# Patient Record
Sex: Female | Born: 1970
Health system: Southern US, Community
[De-identification: ages and names within clinical notes are randomized; demographics above are authoritative.]

## PROBLEM LIST (undated history)

## (undated) DIAGNOSIS — C541 Malignant neoplasm of endometrium: Secondary | ICD-10-CM

## (undated) HISTORY — PX: HYSTEROSCOPY: SHX211

## (undated) HISTORY — PX: PELVIC LAPAROSCOPY: SHX162

## (undated) HISTORY — DX: Malignant neoplasm of endometrium: C54.1

## (undated) HISTORY — PX: BREAST SURGERY: SHX581

## (undated) HISTORY — PX: ABDOMINAL HYSTERECTOMY: SHX81

---

## 2005-02-07 ENCOUNTER — Other Ambulatory Visit: Admission: RE | Admit: 2005-02-07 | Discharge: 2005-02-07 | Payer: Self-pay | Admitting: Obstetrics and Gynecology

## 2005-10-21 HISTORY — PX: BREAST LUMPECTOMY: SHX2

## 2005-10-28 ENCOUNTER — Encounter (INDEPENDENT_AMBULATORY_CARE_PROVIDER_SITE_OTHER): Payer: Self-pay | Admitting: *Deleted

## 2005-10-28 ENCOUNTER — Ambulatory Visit (HOSPITAL_BASED_OUTPATIENT_CLINIC_OR_DEPARTMENT_OTHER): Admission: RE | Admit: 2005-10-28 | Discharge: 2005-10-28 | Payer: Self-pay | Admitting: General Surgery

## 2006-01-15 ENCOUNTER — Ambulatory Visit (HOSPITAL_COMMUNITY): Admission: RE | Admit: 2006-01-15 | Discharge: 2006-01-15 | Payer: Self-pay | Admitting: Obstetrics and Gynecology

## 2006-10-21 HISTORY — PX: MYOMECTOMY: SHX85

## 2007-01-22 ENCOUNTER — Other Ambulatory Visit: Admission: RE | Admit: 2007-01-22 | Discharge: 2007-01-22 | Payer: Self-pay | Admitting: Gynecology

## 2008-02-18 ENCOUNTER — Other Ambulatory Visit: Admission: RE | Admit: 2008-02-18 | Discharge: 2008-02-18 | Payer: Self-pay | Admitting: Gynecology

## 2008-06-02 ENCOUNTER — Ambulatory Visit (HOSPITAL_COMMUNITY): Admission: RE | Admit: 2008-06-02 | Discharge: 2008-06-02 | Payer: Self-pay | Admitting: Gynecology

## 2008-10-21 DIAGNOSIS — C541 Malignant neoplasm of endometrium: Secondary | ICD-10-CM

## 2008-10-21 HISTORY — DX: Malignant neoplasm of endometrium: C54.1

## 2008-10-21 HISTORY — PX: OOPHORECTOMY: SHX86

## 2010-05-28 ENCOUNTER — Encounter (INDEPENDENT_AMBULATORY_CARE_PROVIDER_SITE_OTHER): Payer: Self-pay | Admitting: *Deleted

## 2010-07-04 ENCOUNTER — Ambulatory Visit: Payer: Self-pay | Admitting: Gastroenterology

## 2010-07-04 ENCOUNTER — Encounter (INDEPENDENT_AMBULATORY_CARE_PROVIDER_SITE_OTHER): Payer: Self-pay | Admitting: *Deleted

## 2010-07-04 DIAGNOSIS — R110 Nausea: Secondary | ICD-10-CM | POA: Insufficient documentation

## 2010-07-13 ENCOUNTER — Ambulatory Visit: Payer: Self-pay | Admitting: Gastroenterology

## 2010-07-19 ENCOUNTER — Ambulatory Visit (HOSPITAL_COMMUNITY): Admission: RE | Admit: 2010-07-19 | Discharge: 2010-07-19 | Payer: Self-pay | Admitting: Gastroenterology

## 2010-09-28 ENCOUNTER — Ambulatory Visit: Payer: Self-pay | Admitting: Gastroenterology

## 2010-09-28 DIAGNOSIS — R109 Unspecified abdominal pain: Secondary | ICD-10-CM | POA: Insufficient documentation

## 2010-10-11 ENCOUNTER — Ambulatory Visit (HOSPITAL_COMMUNITY)
Admission: RE | Admit: 2010-10-11 | Discharge: 2010-10-11 | Payer: Self-pay | Source: Home / Self Care | Attending: Gastroenterology | Admitting: Gastroenterology

## 2010-10-12 DIAGNOSIS — R143 Flatulence: Secondary | ICD-10-CM

## 2010-10-12 DIAGNOSIS — R141 Gas pain: Secondary | ICD-10-CM | POA: Insufficient documentation

## 2010-10-12 DIAGNOSIS — R142 Eructation: Secondary | ICD-10-CM

## 2010-10-30 ENCOUNTER — Ambulatory Visit (HOSPITAL_COMMUNITY)
Admission: RE | Admit: 2010-10-30 | Discharge: 2010-10-30 | Payer: Self-pay | Source: Home / Self Care | Attending: Gastroenterology | Admitting: Gastroenterology

## 2010-11-10 ENCOUNTER — Encounter: Payer: Self-pay | Admitting: Obstetrics and Gynecology

## 2010-11-11 ENCOUNTER — Encounter: Payer: Self-pay | Admitting: Obstetrics and Gynecology

## 2010-11-20 NOTE — Letter (Signed)
Summary: EGD Instructions  [facility]  [address] [place], [place] [address]   Phone: [phone]  Fax: [phone]       Joanna Barker    1971/03/19    MRN: [id number]       Procedure Day /Date:07/13/10 [name]     Arrival Time: 930 am     Procedure Time:1030 am     Location of Procedure:                    [facility] (4th Floor)    PREPARATION FOR ENDOSCOPY   On 07/13/10  THE DAY OF THE PROCEDURE:  1.   No solid foods, milk or milk products are allowed after midnight the night before your procedure.  2.   Do not drink anything colored red or purple.  Avoid juices with pulp.  No orange juice.  3.  You may drink clear liquids until 830 am, which is 2 hours before your procedure.                                                                                                CLEAR LIQUIDS INCLUDE: Water Jello Ice Popsicles Tea (sugar ok, no milk/cream) Powdered fruit flavored drinks Coffee (sugar ok, no milk/cream) Gatorade Juice: apple, white grape, white cranberry  Lemonade Clear bullion, consomm, broth Carbonated beverages (any kind) Strained chicken noodle soup Hard Candy   MEDICATION INSTRUCTIONS  Unless otherwise instructed, you should take regular prescription medications with a small sip of water as early as possible the morning of your procedure.            OTHER INSTRUCTIONS  You will need a responsible adult at least 38 years of age to accompany you and drive you home.   This person must remain in the waiting room during your procedure.  Wear loose fitting clothing that is easily removed.  Leave jewelry and other valuables at home.  However, you may wish to bring a book to read or an iPod/MP3 player to listen to music as you wait for your procedure to start.  Remove all body piercing jewelry and leave at home.  Total time from sign-in until discharge is approximately 2-3 hours.  You should go home directly  after your procedure and rest.  You can resume normal activities the day after your procedure.  The day of your procedure you should not:   Drive   Make legal decisions   Operate machinery   Drink alcohol   Return to work  You will receive specific instructions about eating, activities and medications before you leave.    The above instructions have been reviewed and explained to me by   _______________________    I fully understand and can verbalize these instructions _____________________________ Date _________

## 2010-11-20 NOTE — Assessment & Plan Note (Signed)
  Review of gastrointestinal problems: 1. Nausea, ? due to gastritis that was noted on October 2011.  H. pylori biopsies were negative. Previously was treated for H. pylori and EGD er symptoms improved, this was based on serologic evidence. Abdominal ultrasound October 2011 normal.  Trial of Carafate made her symptoms worse.  Nexium was not helpful.    History of Present Illness Visit Type: Follow-up Visit Primary GI MD: Rob Bunting MD Primary Tayshon Winker: Herb Grays, MD Requesting Samual Beals: na Chief Complaint: Patient here to f/u after endoscopy. She complains of continued daily nauesa. History of Present Illness:     she is still nauseas.  A couple days it has been better, wondering if stress is related.  She has noticed being more gassy lately.    she tried carafate for 3-4 weeks, this made her feel worse.  Much more nauseas.  The nausea is fairly constant, not specifically worse after eating.             Current Medications (verified): 1)  Advil 200 Mg Tabs (Ibuprofen) .... As Needed 2)  Hyoscyamine Sulfate 0.125 Mg Subl (Hyoscyamine Sulfate) .... As Needed  Allergies (verified): No Known Drug Allergies  Vital Signs:  Patient profile:   40 year old female Height:      67 inches Weight:      195.38 pounds BMI:     30.71 BSA:     2.00 Pulse rate:   68 / minute Pulse rhythm:   regular BP sitting:   110 / 78  (left arm)  Vitals Entered By: Lamona Curl CMA Duncan Dull) (September 28, 2010 2:14 PM)  Physical Exam  Additional Exam:  Constitutional: generally well appearing Psychiatric: alert and oriented times 3 Abdomen: soft, non-tender, non-distended, normal bowel sounds    Impression & Recommendations:  Problem # 1:  nausea her symptoms are not classic GERD or biliary colic. Carafate make things worse. I will try her on a different proton pump inhibitor, see below. We will also set up HIDA scan to estimate her gallbladder ejection fraction and if she had  significantly low gallbladder ejection fraction then I would likely set her up to see a surgeon to consider cholecystectomy.  Patient Instructions: 1)  HIDA scan to estimate gallbladder function. 2)  Samples of dexilant given, take 1 pill every day. 3)  A copy of this information will be sent to Dr. Collins Scotland. 4)  The medication list was reviewed and reconciled.  All changed / newly prescribed medications were explained.  A complete medication list was provided to the patient / caregiver.  Appended Document: Orders Update/HIDA    Clinical Lists Changes  Problems: Added new problem of ABDOMINAL PAIN OTHER SPECIFIED SITE (ICD-789.09) Orders: Added new Referral order of HIDA Scan (HIDA SCAN) - Signed

## 2010-11-20 NOTE — Procedures (Signed)
Summary: Upper Endoscopy  Patient: Joanna Barker Note: All result statuses are Final unless otherwise noted.  Tests: (1) Upper Endoscopy (EGD)   EGD Upper Endoscopy       DONE     [facility]     [address]     [place], [place]  [address]           ENDOSCOPY PROCEDURE REPORT           PATIENT:  [name], [name]  MR#:  [id number]     BIRTHDATE:  1971-02-16, 39 yrs. old  GENDER:  female     NDOSCOPIST:  Rachael Fee, MD     Referred by:  Herb Grays, M.D.     PROCEDURE DATE:  07/13/2010     PROCEDURE:  EGD with biopsy     ASA CLASS:  Class I     INDICATIONS:  nausea, was serologically H. pylori + several months     ago, treated     MEDICATIONS:  Fentanyl 50 mcg IV, Versed 6 mg IV     TOPICAL ANESTHETIC:  Exactacain Spray           DESCRIPTION OF PROCEDURE:   After the risks benefits and     alternatives of the procedure were thoroughly explained, informed     consent was obtained.  The LB GIF-H180 [id number] endoscope was     introduced through the mouth and advanced to the second portion of     the duodenum, without limitations.  The instrument was slowly     withdrawn as the mucosa was fully examined.     <<PROCEDUREIMAGES>>     Mild gastritis was found. There was mild, non-specific gastritis.     This was biopsied to check for H. pylori (pathology jar 1) (see     image2).  Otherwise the examination was normal (see image3,     image4, image5, image6, and image8).    Retroflexed views revealed     no abnormalities.    The scope was then withdrawn from the patient     and the procedure completed.     COMPLICATIONS:  None           ENDOSCOPIC IMPRESSION:     1) Mild gastritis, biopsies taken for (recurrent) H. pylori     2) Otherwise normal examination           RECOMMENDATIONS:     If biopsies show H. pylori, she will be started on appropriate     antibiotics.  If not, will like proceed with EGD to check for     biliary problems.        ______________________________     Rachael Fee, MD           n.     eSIGNED:   Rachael Fee at 07/13/2010 10:40 AM           Vernona Rieger, [phone]  Note: An exclamation mark (!) indicates a result that was not dispersed into the flowsheet. Document Creation Date: 07/13/2010 10:40 AM _______________________________________________________________________  (1) Order result status: Final Collection or observation date-time: 07/13/2010 10:33 Requested date-time:  Receipt date-time:  Reported date-time:  Referring Physician:   Ordering Physician: Rob Bunting [phone]) Specimen Source:  Source: Launa Grill Order Number: [id number] Lab site:

## 2010-11-20 NOTE — Miscellaneous (Signed)
Summary: Dexilant samples  Clinical Lists Changes  Medications: Added new medication of DEXILANT 60 MG CPDR (DEXLANSOPRAZOLE) 1 by mouth once daily 

## 2010-11-20 NOTE — Letter (Signed)
Summary: New Patient letter  [facility]  [address] [place], [place] [address]   Phone: [phone]  Fax: [phone]       05/28/2010 MRN: [id number]  [facility] [address] [place], [place]  [address]  Dear [name],  Welcome to the Gastroenterology Division at [facility].    You are scheduled to see Dr.  Christella Hartigan  on 07/04/10  at 2:15 pm on the 3rd floor at [facility], [address]  We ask that you try to arrive at our office 15 minutes prior to your appointment time to allow for check-in.  We would like you to complete the enclosed self-administered evaluation form prior to your visit and bring it with you on the day of your appointment.  We will review it with you.  Also, please bring a complete list of all your medications or, if you prefer, bring the medication bottles and we will list them.  Please bring your insurance card so that we may make a copy of it.  If your insurance requires a referral to see a specialist, please bring your referral form from your primary care physician.  Co-payments are due at the time of your visit and may be paid by cash, check or credit card.     Your office visit will consist of a consult with your physician (includes a physical exam), any laboratory testing he/she may order, scheduling of any necessary diagnostic testing (e.g. x-ray, ultrasound, CT-scan), and scheduling of a procedure (e.g. Endoscopy, Colonoscopy) if required.  Please allow enough time on your schedule to allow for any/all of these possibilities.    If you cannot keep your appointment, please call [phone] to cancel or reschedule prior to your appointment date.  This allows [place] the opportunity to schedule an appointment for another patient in need of care.  If you do not cancel or reschedule by 5 p.m. the business day prior to your appointment date, you will be charged a $50.00 late cancellation/no-show fee.    Thank you for  choosing [facility] for your medical needs.  We appreciate the opportunity to care for you.  Please visit [place] at our website  to learn more about our practice.                     Sincerely,                                                             The Gastroenterology Division

## 2010-11-20 NOTE — Assessment & Plan Note (Signed)
Summary: Nausea x 1 month/pl   History of Present Illness Visit Type: new patient  Primary GI MD: Rob Bunting MD Primary Provider: Earma Reading, MD  Requesting Provider: na Chief Complaint: nausea  History of Present Illness:     very pleasant 40 year old woman who has had nausea since early July.  Will wax and wane, but alway a low level of nausea.  No vomiting.  She had a similar set of symptoms back in April.  Was found to have H. pylori by blood tests, treated with pre-pac.  She completed it, felt better for 2 months then the same symptoms.  She saw Dr. Bosie Clos, was given sample and noticed no change.  Was 45 min before breakfast.  No narcotic.  Takes one advil every 3-4 weeks.  She had hysterectomy last year, was going through infertility treatments and found to have endometrial cancer.  Alternates between constip/diarrhea.  Has never tried fiber supplement.s  overall stable weight.           Current Medications (verified): 1)  Advil 200 Mg Tabs (Ibuprofen) .... As Needed 2)  Hyoscyamine Sulfate 0.125 Mg Subl (Hyoscyamine Sulfate) .... 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  All other review of systems was otherwise negative.   Vital Signs:  Patient profile:   40 year old female Height:      67 inches Weight:      202 pounds BMI:      31.75 BSA:     2.03 Pulse rate:   68 / minute Pulse rhythm:   regular BP sitting:   124 / 80  (left arm) Cuff size:   regular  Vitals Entered By: Ok Anis CMA (July 04, 2010 2:22 PM)  Physical Exam  Additional Exam:  Constitutional: generally well appearing Psychiatric: alert and oriented times 3 Eyes: extraocular movements intact Mouth: oropharynx moist, no lesions Neck: supple, no lymphadenopathy Cardiovascular: heart regular rate and rythm Lungs: CTA bilaterally Abdomen: soft, non-tender, non-distended, no obvious ascites, no peritoneal signs, normal bowel sounds Extremities: no lower extremity edema bilaterally Skin: no lesions on visible extremities    Impression & Recommendations:  Problem # 1:  Chronic nausea it is very interesting that following Prevpac treatment her nausea was much improved for about 2 months. I think we should proceed with EGD at her soonest convenience to see if she has persistent H. pylori, perhaps gastritis, ulcer disease. She has really not noticed any significant improvement on proton pump inhibitor for 3-4 week trial and so does not seem acid alone is causing her symptoms. If EGD is unrevealing then the next step would be biliary workup.  Patient Instructions: 1)  You will be scheduled to have an upper endoscopy. 2)  If this is not really helpful, then will consider gallbladder pathology. 3)  A copy of this information will be sent to Dr. Romeo Rabon. 4)  The medication list was reviewed and reconciled.  All changed / newly prescribed medications were explained.  A complete medication list was provided to the patient / caregiver.  Appended Document: Orders Update/EGD    Clinical Lists Changes  Problems: Added new problem of NAUSEA ALONE (ICD-787.02) Orders: Added new Test order of EGD (EGD) - Signed

## 2011-03-08 NOTE — Op Note (Signed)
NAMEMarland Barker  LARAH, KUNTZMAN                 ACCOUNT NO.:  [id number]   MEDICAL RECORD NO.:  [id number]          PATIENT TYPE:  AMB   LOCATION:  DSC                          FACILITY:  [facility]   PHYSICIAN:  Rose Phi. Maple Hudson, M.D.   DATE OF BIRTH:  Mar 14, 1971   DATE OF PROCEDURE:  10/28/2005  DATE OF DISCHARGE:                                 OPERATIVE REPORT   PREOPERATIVE DIAGNOSIS:  Right breast mass.   POSTOPERATIVE DIAGNOSIS:  Right breast mass.   OPERATION:  Excision of right breast mass.   SURGEON:  Rose Phi. Maple Hudson, M.D.   ANESTHESIA:  MAC   OPERATIVE PROCEDURE:  This 40 year old female had been referred by Dr.  Marcelle Overlie because of a palpable nodule at the 7 o'clock position of  her right breast. Mr. And Mrs. Reppond were trying to get pregnant, and Dr.  Lynnell Dike concern was that this was enlarge during the pregnancy and become a  diagnostic problem. She is set up now for excision.   The patient was placed on the operating table and the right breast prepped  and draped in the usual fashion. The palpable nodule was subareolar,  centered at about the 7 o'clock position; and a small curved incision was  then outlined.. The area was infiltrated with the local anesthetic mixture  and an incision made and the mass excised. It really did not feel or look  like a fibroadenoma, but it also did not look malignant. It was submitted to  the pathologist for evaluation. Hemostasis obtained with the cautery.  Subcuticular closure of 4-0 Monocryl and Steri-Strips carried out. Dressing  applied. The patient transferred to the recovery room in satisfactory  condition having tolerated the procedure well.      Rose Phi. Maple Hudson, M.D.  Electronically Signed     PRY/MEDQ  D:  10/28/2005  T:  10/28/2005  Job:  [id number]   cc:   Marcelino Duster L. Vincente Poli, M.D.  Fax: [phone]

## 2012-08-27 ENCOUNTER — Ambulatory Visit (INDEPENDENT_AMBULATORY_CARE_PROVIDER_SITE_OTHER): Payer: 59 | Admitting: Sports Medicine

## 2012-08-27 VITALS — BP 120/70 | Ht 67.0 in | Wt 160.0 lb

## 2012-08-27 DIAGNOSIS — IMO0002 Reserved for concepts with insufficient information to code with codable children: Secondary | ICD-10-CM

## 2012-08-27 DIAGNOSIS — M7050 Other bursitis of knee, unspecified knee: Secondary | ICD-10-CM

## 2012-08-27 DIAGNOSIS — M25569 Pain in unspecified knee: Secondary | ICD-10-CM

## 2012-08-27 MED ORDER — MELOXICAM 15 MG PO TABS: ORAL_TABLET | ORAL | Status: DC

## 2012-08-27 NOTE — Progress Notes (Signed)
Patient ID: Joanna Barker, female   DOB: 10-27-1970, 41 y.o.   MRN: [id number]  SUBJECTIVE: Joanna Barker is a 41 y.o. female with unremarkable musculoskeletal history who presents for LEFT medial knee pain.  Pt reports that while running the [employer] marathon on 16-Aug-2012 she was running well at her race pace and pain free (10:30 / mile).  She decided to reward herself by walking for a short bit at mile 20.5 and on her third step while walking she experienced sudden, sharp pain along the medial aspect of her LEFT knee.  She reports that it felt like her knee was very tight, stiff and she had to stop and stretch.  She was able to complete the marathon but had significant pain and predominantly walked the remainder of the race, only slowly jogging in short bursts.  She had some popping and clicking in her knee during the remainder of the race but not since.  No true locking or catching, no instability.  enies stepping in a hole, trauma, twisting ankle...etc at time of onset.  No prior history of knee, or any orthopedic, injuries.  She took 1 week off following the race.  Then on Monday of this week tried to do a 3 mile run but had significant pain and had to stop and walk on several occasions.  Denies any significant pain with walking, only occasional and after prolonged walking especially uphill or up a ramp.    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 Appearance - fit, well appearing female in no acute distress  Knee exam:   - No gross abnormalities on inspection - AROM symmetric and appropriate in knee flexion, extension with no pain in either - 5/5 strength in each of the above without pain - 5/5 strength in hip abduction, adduction (lateral seated) and flexion/extension (seated/prone) - On palpation there is moderate medial joint line tenderness of the LEFT knee; patient also moderate tenderness to palpation of pes anserine bursa on LEFT knee.  Pt reports slightly more pain @ pes when comparing palpation of both - Negative dial test bilaterally - Negative thessaly test bilaterally at 5 and 30 degrees knee flexion - Negative McMurray's bialterally - Negative Lachman's, Anterior / Posterior drawer - Negative valgus and varus stress testing with firm endpoints.  Pain when hand is placed over medial joint line for varus stress testing. - Negative heel tap test, negative to palpation and tapping along length of tibia - Negative hop test bilaterally  [facility] ultrasound of the left knee showed no obvious abnormality of the medial meniscus. There is some mild edema at the pes anserine bursa. No significant increase in neovascularity.   ASSESSMENT: 1. Pes Anserine Bursitis (most likely), LEFT knee - (less likely but considered) medial meniscus injury and tibial stress fracture  PLAN: 41 y/o female with unremarkable orthopedic history presenting for medial knee pain of sudden onset while running marathon.  Pain persisted despite 1 week of rest.  Historical and physical exam findings above support pes anserine bursitis as most likely source of  pain, although we also considered medial meniscus injury and tibial stress fracture.  The other two differentials are not supported by exam findings as much as pes anserine bursitis.  She was given body helix compression sleeve for LEFT knee to wear when active and for 1 hour afterward.  She will cross-train or rest for the next 3 weeks to allow bursitis to heal, this would be similar  initial approach for the other two injuries as well.  She will follow-up in three weeks for re-evaluation of pain and re-assessment.  If pain persists despite rest, we will consider more aggressive imaging of the knee to r/o the aforementioned possibilities and/or injection of pes anserine bursa to calm down inflammation and facilitate healing.  -- Kenney Houseman, MS IV

## 2012-09-15 ENCOUNTER — Ambulatory Visit (INDEPENDENT_AMBULATORY_CARE_PROVIDER_SITE_OTHER): Payer: 59 | Admitting: Sports Medicine

## 2012-09-15 VITALS — BP 120/60 | Ht 67.0 in | Wt 158.5 lb

## 2012-09-15 DIAGNOSIS — M25562 Pain in left knee: Secondary | ICD-10-CM

## 2012-09-15 DIAGNOSIS — M25569 Pain in unspecified knee: Secondary | ICD-10-CM

## 2012-09-15 NOTE — Patient Instructions (Addendum)
Due to financial reasons, scheduled pt to go to [facility] t[facility] December 3rd at 915am. Pt aware. [phone]

## 2012-09-15 NOTE — Progress Notes (Signed)
  Subjective:    Patient ID: Joanna Barker, female    DOB: [birth year], 41 y.o.   MRN: [id number]  HPI Patient comes in today for followup on left knee pain. She's had little improvement in pain since her last office visit. Mobic was not very helpful. Body heel is compression sleeve is minimally helpful. She localizes all of her pain now to the medial joint line. The pain she was getting over the pes anserine bursa previously has improved. She has been crosstraining on both a bike and elliptical. No real pain while exercising but she has post exercise pain and catching. No noticeable swelling. Pain improves at rest. Her symptoms all began after running a marathon. [facility] ultrasound done at her last visit showed no obvious meniscal tear she did have some fluid in the area of the pes anserine bursa.    Review of Systems     Objective:   Physical Exam Well-developed, well-nourished. No acute distress.  Left knee: Range of motion 0-130. She is tender to palpation along the medial joint line with a positive McMurray's on today's exam. No tenderness along the lateral joint line. No tenderness at the pes anserine bursa. Knee remained stable to ligamentous exam. She walks with a slight limp.       Assessment & Plan:  1. Persistent left knee pain-rule out proximal tibial stress fracture versus medial meniscal tear  I will start with plain x-rays of the left knee. If unremarkable we will proceed with an MRI scan. She will continue to cross train in the interim. She can discontinue Mobic since it's been ineffective. Continue with the compression sleeve with activity. I will call her after her MRI to go over those results and delineate further treatment.

## 2012-09-15 NOTE — Addendum Note (Signed)
Addended by: Annita Brod on: 09/15/2012 02:03 PM   Modules accepted: Orders

## 2012-09-24 ENCOUNTER — Other Ambulatory Visit: Payer: 59

## 2012-09-29 ENCOUNTER — Encounter: Payer: Self-pay | Admitting: Sports Medicine

## 2012-09-29 ENCOUNTER — Telehealth: Payer: Self-pay | Admitting: Sports Medicine

## 2012-09-29 NOTE — Telephone Encounter (Signed)
I spoke with Joanna Barker yesterday on the phone regarding MRI findings of her left knee. No evidence of meniscal tearing or stress fracture. She has some edema of the lateral aspect of the infrapatellar fat pad. Some mild chondromalacia patella as well. I reassured her that with these findings she can continue to increase her activity as tolerated including running. She did not notice any benefit from Mobic so I recommended that she try a simple over-the-counter Aleve as needed. Also discussed the possibility of physical therapy if her symptoms persist. She will followup for ongoing or recalcitrant issues.

## 2013-11-30 ENCOUNTER — Encounter: Payer: Self-pay | Admitting: Family Medicine

## 2013-11-30 ENCOUNTER — Ambulatory Visit (HOSPITAL_BASED_OUTPATIENT_CLINIC_OR_DEPARTMENT_OTHER)
Admission: RE | Admit: 2013-11-30 | Discharge: 2013-11-30 | Disposition: A | Discharge status: Home / Self Care | Payer: 59 | Source: Ambulatory Visit | Attending: Family Medicine | Admitting: Family Medicine

## 2013-11-30 ENCOUNTER — Ambulatory Visit (INDEPENDENT_AMBULATORY_CARE_PROVIDER_SITE_OTHER): Payer: 59 | Admitting: Family Medicine

## 2013-11-30 ENCOUNTER — Encounter (INDEPENDENT_AMBULATORY_CARE_PROVIDER_SITE_OTHER): Payer: Self-pay

## 2013-11-30 VITALS — BP 119/80 | HR 72 | Ht 67.0 in | Wt 165.0 lb

## 2013-11-30 DIAGNOSIS — M25561 Pain in right knee: Secondary | ICD-10-CM

## 2013-11-30 DIAGNOSIS — M25569 Pain in unspecified knee: Secondary | ICD-10-CM | POA: Insufficient documentation

## 2013-11-30 NOTE — Progress Notes (Addendum)
Patient ID: Joanna Barker, female   DOB: 01/25/1971, 43 y.o.   MRN: [id number]  PCP: Florina Ou, MD  Subjective:   HPI: Patient is a 43 y.o. female here for right knee pain.  Patient denies acute injury. She had similar issues with left knee over a year ago - MRI with chondromalacia, no stress fracture. Overall that knee improved. Current pain started over 2 weeks ago medial knee. Noticed about 11 1/2 miles into a run. Rested a little after this then next run had to stop around 5 miles into a 7 mile run because of pain in same area. Wednesday 2.5 miles in had pain and finally did a relay race this weekend - [place]. Ok at rest though some achiness.  Pain to an 8-9/10 with running. Has a brace from before - using this some. Icing and taking advil.  History reviewed. No pertinent past medical history.  Current Outpatient Prescriptions on File Prior to Visit  Medication Sig Dispense Refill  . meloxicam (MOBIC) 15 MG tablet Take by mouth once daily with food for 1 week, then take as needed  30 tablet  1   No current facility-administered medications on file prior to visit.    History reviewed. No pertinent past surgical history.  No Known Allergies  History   Social History  . Marital Status: Married    Spouse Name: N/A    Number of Children: N/A  . Years of Education: N/A   Occupational History  . Not on file.   Social History Main Topics  . Smoking status: Never Smoker   . Smokeless tobacco: Not on file  . Alcohol Use: Not on file  . Drug Use: Not on file  . Sexual Activity: Not on file   Other Topics Concern  . Not on file   Social History Narrative  . No narrative on file    Family History  Problem Relation Age of Onset  . Hypertension Mother   . Hypertension Father   . Diabetes Neg Hx   . Heart attack Neg Hx   . Hyperlipidemia Neg Hx   . Sudden death Neg Hx     BP 119/80  Pulse 72  Ht 5\' 7"  (1.702 m)  Wt 165 lb  (74.844 kg)  BMI 25.84 kg/m2  Review of Systems: See HPI above.    Objective:  Physical Exam:  Gen: NAD  Right knee: No gross deformity, ecchymoses, effusion. TTP medial tibial plateau.  Less medial joint line.  No post patellar facet tenderness. FROM. Negative ant/post drawers. Negative valgus/varus testing. Negative lachmanns. Negative mcmurrays, apleys, patellar apprehension, clarkes, thessalys. Negative tuning fork, fulcrum, hop tests. NV intact distally.    Assessment & Plan:  1. Right knee pain - History and location very concerning for proximal tibia stress fracture.  Initial radiographs negative but she's 2 weeks out from when pain started - would expect if this would appear on x-rays it should at this point.  Will move forward with MRI to assess for stress fracture.  No running in meantime.  Addendum:  MRI reviewed and discussed with patient - has evidence of chondromalacia but no stress fracture, other worrisome findings.  Advised activities as tolerated.  Consider physical therapy, improved arch support, icing, nsaids, return to running program.  F/u prn.

## 2013-11-30 NOTE — Assessment & Plan Note (Signed)
History and location very concerning for proximal tibia stress fracture.  Initial radiographs negative but she's 2 weeks out from when pain started - would expect if this would appear on x-rays it should at this point.  Will move forward with MRI to assess for stress fracture.  No running in meantime.

## 2013-11-30 NOTE — Patient Instructions (Signed)
We will try to get an MRI approved to assess for a tibial stress fracture. No running in meantime. Icing 15 minutes at a time 3-4 times a day. Ibuprofen or tylenol as needed for pain. Straight leg raises, hamstring curls, leg raises with foot turned outwards 3 sets of 10 once a day. Add ankle weight if these become too easy.

## 2013-12-01 ENCOUNTER — Ambulatory Visit
Admission: RE | Admit: 2013-12-01 | Discharge: 2013-12-01 | Disposition: A | Discharge status: Home / Self Care | Payer: 59 | Source: Ambulatory Visit | Attending: Family Medicine | Admitting: Family Medicine

## 2013-12-01 DIAGNOSIS — M25561 Pain in right knee: Secondary | ICD-10-CM

## 2014-09-21 DIAGNOSIS — F419 Anxiety disorder, unspecified: Secondary | ICD-10-CM | POA: Insufficient documentation

## 2015-06-22 ENCOUNTER — Encounter: Payer: Self-pay | Admitting: Family Medicine

## 2015-06-22 ENCOUNTER — Encounter (INDEPENDENT_AMBULATORY_CARE_PROVIDER_SITE_OTHER): Payer: Self-pay

## 2015-06-22 ENCOUNTER — Ambulatory Visit (INDEPENDENT_AMBULATORY_CARE_PROVIDER_SITE_OTHER): Payer: 59 | Admitting: Family Medicine

## 2015-06-22 ENCOUNTER — Ambulatory Visit (HOSPITAL_BASED_OUTPATIENT_CLINIC_OR_DEPARTMENT_OTHER)
Admission: RE | Admit: 2015-06-22 | Discharge: 2015-06-22 | Disposition: A | Discharge status: Home / Self Care | Payer: 59 | Source: Ambulatory Visit | Attending: Family Medicine | Admitting: Family Medicine

## 2015-06-22 VITALS — BP 125/81 | HR 70 | Ht 67.0 in | Wt 170.0 lb

## 2015-06-22 DIAGNOSIS — S8992XA Unspecified injury of left lower leg, initial encounter: Secondary | ICD-10-CM

## 2015-06-22 DIAGNOSIS — M7989 Other specified soft tissue disorders: Secondary | ICD-10-CM | POA: Diagnosis not present

## 2015-06-22 DIAGNOSIS — M25562 Pain in left knee: Secondary | ICD-10-CM | POA: Diagnosis present

## 2015-06-22 NOTE — Patient Instructions (Signed)
You have a knee contusion, hematoma. Continue icing for at least 2 more days 15 minutes at a time 3-4 times a day. ACE wrap or sleeve for compression. Elevate above your heart when possible. Ibuprofen 600mg  three times a day OR aleve 2 tabs twice a day with food for pain and inflammation. Activities as tolerated - ok to return to running when not limping and pain is less than 3 on a scale of 1-10. Would cross train in meantime. Follow up with me in 1 month or as needed.

## 2015-06-23 NOTE — Progress Notes (Signed)
PCP: Florina Ou, MD  Subjective:   HPI: Patient is a 44 y.o. female here for left knee injury.  Patient reports yesterday 8/31 she accidentally fell up stairs striking left kneecap directly on a stair. No pain prior to this. A lot of swelling, bruising. Pain level has decreased to 6/10. Able to walk with a limp. No catching, locking, giving out. Iced a lot, taking advil.  No past medical history on file.  Current Outpatient Prescriptions on File Prior to Visit  Medication Sig Dispense Refill  . meloxicam (MOBIC) 15 MG tablet Take by mouth once daily with food for 1 week, then take as needed 30 tablet 1   No current facility-administered medications on file prior to visit.    No past surgical history on file.  No Known Allergies  Social History   Social History  . Marital Status: Married    Spouse Name: N/A  . Number of Children: N/A  . Years of Education: N/A   Occupational History  . Not on file.   Social History Main Topics  . Smoking status: Never Smoker   . Smokeless tobacco: Not on file  . Alcohol Use: Not on file  . Drug Use: Not on file  . Sexual Activity: Not on file   Other Topics Concern  . Not on file   Social History Narrative    Family History  Problem Relation Age of Onset  . Hypertension Mother   . Hypertension Father   . Diabetes Neg Hx   . Heart attack Neg Hx   . Hyperlipidemia Neg Hx   . Sudden death Neg Hx     BP 125/81 mmHg  Pulse 70  Ht 5\' 7"  (1.702 m)  Wt 170 lb (77.111 kg)  BMI 26.62 kg/m2  Review of Systems: See HPI above.    Objective:  Physical Exam:  Gen: NAD  Left knee: Mild bruising, swelling anterior knee though no effusion.   TTP anteriorly diffusely.  No joint line tenderness. FROM - extensor mechanism intact. Negative ant/post drawers. Negative valgus/varus testing. Negative lachmanns. Negative mcmurrays, apleys.  Painful patellar apprehension. NV intact distally.  MSK u/s:  No evidence of effusion.   Quad and patellar tendons intact.  Soft tissue swelling, bruising noted superficial to and surrounding patella.  No clear hypoechoic area of prepatellar bursa.    Assessment & Plan:  1. Left knee injury - consistent with knee contusion, hematoma.  Exam, ultrasound reassuring.  Icing, compression, elevation, regular nsaids.  Activities as tolerated.  F/u in 1 month or prn.

## 2015-06-27 DIAGNOSIS — S8992XA Unspecified injury of left lower leg, initial encounter: Secondary | ICD-10-CM | POA: Insufficient documentation

## 2015-06-27 NOTE — Assessment & Plan Note (Signed)
consistent with knee contusion, hematoma.  Exam, ultrasound reassuring.  Icing, compression, elevation, regular nsaids.  Activities as tolerated.  F/u in 1 month or prn.

## 2015-08-29 ENCOUNTER — Encounter: Payer: Self-pay | Admitting: Gastroenterology

## 2016-08-30 ENCOUNTER — Other Ambulatory Visit: Payer: Self-pay | Admitting: Family Medicine

## 2016-08-30 DIAGNOSIS — G4452 New daily persistent headache (NDPH): Secondary | ICD-10-CM

## 2016-09-17 ENCOUNTER — Encounter: Payer: Self-pay | Admitting: Obstetrics and Gynecology

## 2016-09-17 ENCOUNTER — Other Ambulatory Visit: Payer: 59

## 2016-09-17 ENCOUNTER — Ambulatory Visit (INDEPENDENT_AMBULATORY_CARE_PROVIDER_SITE_OTHER): Payer: 59 | Admitting: Obstetrics and Gynecology

## 2016-09-17 VITALS — BP 126/70 | HR 64 | Resp 14 | Ht 67.0 in | Wt 190.0 lb

## 2016-09-17 DIAGNOSIS — R232 Flushing: Secondary | ICD-10-CM

## 2016-09-17 DIAGNOSIS — R51 Headache: Secondary | ICD-10-CM

## 2016-09-17 DIAGNOSIS — R635 Abnormal weight gain: Secondary | ICD-10-CM | POA: Diagnosis not present

## 2016-09-17 DIAGNOSIS — R5383 Other fatigue: Secondary | ICD-10-CM

## 2016-09-17 DIAGNOSIS — Z8542 Personal history of malignant neoplasm of other parts of uterus: Secondary | ICD-10-CM

## 2016-09-17 DIAGNOSIS — R519 Headache, unspecified: Secondary | ICD-10-CM

## 2016-09-17 DIAGNOSIS — Z01419 Encounter for gynecological examination (general) (routine) without abnormal findings: Secondary | ICD-10-CM | POA: Diagnosis not present

## 2016-09-17 NOTE — Progress Notes (Signed)
45 y.o. G0P0000 MarriedCaucasianF here for annual exam.  She has a h/o "Stage I-A, Grade 1 adenocarcinoma of the endometrium status post elective laparoscopic hysterectomy on 5/10 with preservation of the right ovary and fallopian tube". They had been trying to get pregnant and as part of the w/u was diagnosed with hyperplasia, treated with provera, still progressed to endometrial cancer.  She c/o an ongoing recurring headache every day since August. She has increased fatigue. Harder to do her normal run. No night sweats, some mild hot flashes. No vaginal dryness. No pain with intercourse. No vaginal bleeding.  She has gained 25 lbs this year. No major changes in diet or exercise. Exercises 6-7 days a week, 30 minutes to 2 hours. Runs 3-12 miles, boot camp for an hour a day 3 days a week. No change in her work out schedule.  She recently saw her primary, had a normal TSH, not sure what else was done.  Primary recommended she have an MRI for her headaches. She just got new glasses.  Not sleeping well, wakes up and can't go back to sleep. Stressing. Doesn't feel anxious. Lots going on at work.     No LMP recorded. Patient has had a hysterectomy.          Sexually active: Yes.    The current method of family planning is status post hysterectomy.   TLH/LSO Exercising: Yes.    running/ weight training  Smoker:  no  Health Maintenance: Pap:  2015 WNL per patient  History of abnormal Pap:  no MMG:  06/2016 WNL per patient  Colonoscopy:  Several yrs ago WNL per patient  BMD:   Never TDaP:  Unsure  Gardasil: N/A   reports that she has never smoked. She has never used smokeless tobacco. She reports that she drinks about 1.2 - 1.8 oz of alcohol per week . She reports that she does not use drugs. She works for a [employer], [employer].   Past Medical History:  Diagnosis Date  . Cancer ([place])   . Endometrial cancer ([place]) 2010    Past Surgical History:  Procedure Laterality Date  . ABDOMINAL  HYSTERECTOMY    . BREAST LUMPECTOMY Right 2007  . BREAST SURGERY     lumpectomy   . HYSTEROSCOPY    . OOPHORECTOMY Left 2010  . PELVIC LAPAROSCOPY      No current outpatient prescriptions on file.   No current facility-administered medications for this visit.     Family History  Problem Relation Age of Onset  . Hypertension Mother   . Hypertension Father   . Rheum arthritis Father   . Diabetes Neg Hx   . Heart attack Neg Hx   . Hyperlipidemia Neg Hx   . Sudden death Neg Hx     Review of Systems  Constitutional: Positive for fatigue and unexpected weight change.       Weight gain   HENT: Negative.   Eyes: Negative.   Respiratory: Negative.   Cardiovascular: Negative.   Gastrointestinal: Negative.   Endocrine: Negative.   Genitourinary: Negative.   Musculoskeletal: Negative.   Skin: Negative.   Allergic/Immunologic: Negative.   Neurological: Positive for headaches.  Psychiatric/Behavioral: Positive for sleep disturbance.    Exam:   BP 126/70 (BP Location: Right Arm, Patient Position: Sitting, Cuff Size: Normal)   Pulse 64   Resp 14   Ht 5\' 7"  (1.702 m)   Wt 190 lb (86.2 kg)   BMI 29.76 kg/m   Weight change: @  WEIGHTCHANGE@ Height:   Height: 5\' 7"  (170.2 cm)  Ht Readings from Last 3 Encounters:  09/17/16 5\' 7"  (1.702 m)  06/22/15 5\' 7"  (1.702 m)  11/30/13 5\' 7"  (1.702 m)    General appearance: alert, cooperative and appears stated age Head: Normocephalic, without obvious abnormality, atraumatic Neck: no adenopathy, supple, symmetrical, trachea midline and thyroid normal to inspection and palpation Lungs: clear to auscultation bilaterally Breasts: normal appearance, no masses or tenderness Heart: regular rate and rhythm Abdomen: soft, non-tender; bowel sounds normal; no masses,  no organomegaly Extremities: extremities normal, atraumatic, no cyanosis or edema Skin: Skin color, texture, turgor normal. No rashes or lesions Lymph nodes: Cervical,  supraclavicular, and axillary nodes normal. No abnormal inguinal nodes palpated Neurologic: Grossly normal   Pelvic: External genitalia:  no lesions              Urethra:  normal appearing urethra with no masses, tenderness or lesions              Bartholins and Skenes: normal                 Vagina: normal appearing vagina with normal color and discharge, no lesions              Cervix: absent               Bimanual Exam:  Uterus:  uterus absent              Adnexa: no mass, fullness, tenderness               Rectovaginal: Confirms               Anus:  normal sphincter tone, no lesions  Chaperone was present for exam.  A:  Well Woman with normal exam  Fatigue, headaches and weight gain, some hot flashes  H/O endometrial cancer, grade 1, stage 1. Still has one ovary  Sleep disturbance, wakes up and can't go back to sleep    P:   Get a copy of her recent lab work (will make sure she has had a CBC, TSH and CMP)  Will order a FSH, estradiol, vit D   No pap needed  Mammogram UTD  Discussed breast self exam  Discussed calcium and vit D intake  Discussed weight watchers (she has done this before), portion control

## 2016-09-17 NOTE — Patient Instructions (Signed)

## 2016-09-18 ENCOUNTER — Other Ambulatory Visit: Payer: 59

## 2016-09-18 LAB — ESTRADIOL: Estradiol: 94 pg/mL

## 2016-09-18 LAB — FOLLICLE STIMULATING HORMONE: FSH: 18 m[IU]/mL

## 2016-09-23 ENCOUNTER — Ambulatory Visit
Admission: RE | Admit: 2016-09-23 | Discharge: 2016-09-23 | Disposition: A | Discharge status: Home / Self Care | Payer: 59 | Source: Ambulatory Visit | Attending: Family Medicine | Admitting: Family Medicine

## 2016-09-23 DIAGNOSIS — G4452 New daily persistent headache (NDPH): Secondary | ICD-10-CM

## 2016-09-23 MED ORDER — GADOBENATE DIMEGLUMINE 529 MG/ML IV SOLN
16.0000 mL | Freq: Once | INTRAVENOUS | Status: AC | PRN
  Administered 2016-09-23: 16 mL via INTRAVENOUS

## 2016-10-02 ENCOUNTER — Ambulatory Visit (INDEPENDENT_AMBULATORY_CARE_PROVIDER_SITE_OTHER): Payer: 59 | Admitting: Family Medicine

## 2016-10-02 ENCOUNTER — Encounter: Payer: Self-pay | Admitting: Family Medicine

## 2016-10-02 DIAGNOSIS — M722 Plantar fascial fibromatosis: Secondary | ICD-10-CM

## 2016-10-02 NOTE — Patient Instructions (Addendum)
You have plantar fasciitis Take tylenol or aleve as needed for pain  Plantar fascia stretch for 20-30 seconds (do 3 of these) in morning Lowering/raise on a step exercises 3 x 10 once or twice a day - this is very important for long term recovery. Can add heel walks, toe walks forward and backward as well Ice heel for 15 minutes as needed. Avoid flat shoes/barefoot walking as much as possible. Arch straps have been shown to help with pain. Inserts are very beneficial (something like dr. Zoe Lan active series, spencos, our sports insoles). Consider injection, physical therapy, custom orthotics, night splints, massage if not improving. Follow up with me in 6 weeks.

## 2016-10-03 DIAGNOSIS — M722 Plantar fascial fibromatosis: Secondary | ICD-10-CM | POA: Insufficient documentation

## 2016-10-03 NOTE — Progress Notes (Signed)
PCP: Tawanna Solo, MD  Subjective:   HPI: Patient is a 45 y.o. female here for right foot pain.  Patient reports she's been running about 10 miles a week recently due to pain plantar right heel. Pain past 6 months in plantar heel. No obvious injury or trauma. No swelling or bruising. Whole heel bothers her when running. Calf is tight. Has been doing foam roller of this. Pain 2/10, sharp. No skin changes, numbness.  Past Medical History:  Diagnosis Date  . Cancer ([place])   . Endometrial cancer ([place]) 2010    No current outpatient prescriptions on file prior to visit.   No current facility-administered medications on file prior to visit.     Past Surgical History:  Procedure Laterality Date  . ABDOMINAL HYSTERECTOMY    . BREAST LUMPECTOMY Right 2007  . BREAST SURGERY     lumpectomy   . HYSTEROSCOPY    . MYOMECTOMY  2008  . OOPHORECTOMY Left 2010  . PELVIC LAPAROSCOPY      No Known Allergies  Social History   Social History  . Marital status: Married    Spouse name: N/A  . Number of children: N/A  . Years of education: N/A   Occupational History  . Not on file.   Social History Main Topics  . Smoking status: Never Smoker  . Smokeless tobacco: Never Used  . Alcohol use 1.2 - 1.8 oz/week    2 - 3 Standard drinks or equivalent per week  . Drug use: No  . Sexual activity: Yes    Partners: Male    Birth control/ protection: Surgical   Other Topics Concern  . Not on file   Social History Narrative  . No narrative on file    Family History  Problem Relation Age of Onset  . Hypertension Mother   . Hypertension Father   . Rheum arthritis Father   . Diabetes Neg Hx   . Heart attack Neg Hx   . Hyperlipidemia Neg Hx   . Sudden death Neg Hx     BP 116/80   Pulse 75   Ht 5\' 7"  (1.702 m)   Wt 175 lb (79.4 kg)   PF (!) 2 L/min   BMI 27.41 kg/m   Review of Systems: See HPI above.     Objective:  Physical Exam:  Gen: NAD, comfortable in exam  room  Right foot/ankle: No gross deformity, swelling, ecchymoses FROM TTP medial calcaneus at plantar fascia insertion. Negative ant drawer and talar tilt.   Negative syndesmotic compression. Negative calcaneal squeeze. Thompsons test negative. NV intact distally.  Left foot/ankle: FROM without pain.   Assessment & Plan:  1. Right plantar fasciitis - reviewed home exercises, stretches to do daily.  Arch binders, inserts as well.  Icing, tylenol or aleve if needed.  Consider PT, injection, custom orthotics, night splints, massage if not improving.  F/u in 6 weeks.

## 2016-10-03 NOTE — Assessment & Plan Note (Signed)
reviewed home exercises, stretches to do daily.  Arch binders, inserts as well.  Icing, tylenol or aleve if needed.  Consider PT, injection, custom orthotics, night splints, massage if not improving.  F/u in 6 weeks.

## 2016-10-10 ENCOUNTER — Telehealth: Payer: Self-pay | Admitting: Obstetrics and Gynecology

## 2016-10-10 NOTE — Telephone Encounter (Signed)
Please let the patient know that her labs were received from her primary, nothing else needed

## 2016-10-16 NOTE — Telephone Encounter (Signed)
Left patient a message letting her know we receive her labs from her previous provider (ok per DPR) -eh

## 2016-10-25 DIAGNOSIS — J069 Acute upper respiratory infection, unspecified: Secondary | ICD-10-CM | POA: Diagnosis not present

## 2016-10-25 DIAGNOSIS — G4452 New daily persistent headache (NDPH): Secondary | ICD-10-CM | POA: Diagnosis not present

## 2016-11-04 DIAGNOSIS — J019 Acute sinusitis, unspecified: Secondary | ICD-10-CM | POA: Diagnosis not present

## 2016-11-04 DIAGNOSIS — B9689 Other specified bacterial agents as the cause of diseases classified elsewhere: Secondary | ICD-10-CM | POA: Diagnosis not present

## 2016-11-04 DIAGNOSIS — R05 Cough: Secondary | ICD-10-CM | POA: Diagnosis not present

## 2016-11-04 DIAGNOSIS — R0602 Shortness of breath: Secondary | ICD-10-CM | POA: Diagnosis not present

## 2016-11-11 DIAGNOSIS — B9689 Other specified bacterial agents as the cause of diseases classified elsewhere: Secondary | ICD-10-CM | POA: Diagnosis not present

## 2016-11-11 DIAGNOSIS — J019 Acute sinusitis, unspecified: Secondary | ICD-10-CM | POA: Diagnosis not present

## 2016-11-22 DIAGNOSIS — Z1231 Encounter for screening mammogram for malignant neoplasm of breast: Secondary | ICD-10-CM | POA: Diagnosis not present

## 2017-01-22 DIAGNOSIS — M79671 Pain in right foot: Secondary | ICD-10-CM | POA: Diagnosis not present

## 2017-04-08 DIAGNOSIS — M9901 Segmental and somatic dysfunction of cervical region: Secondary | ICD-10-CM | POA: Diagnosis not present

## 2017-04-08 DIAGNOSIS — M5020 Other cervical disc displacement, unspecified cervical region: Secondary | ICD-10-CM | POA: Diagnosis not present

## 2017-04-08 DIAGNOSIS — M5412 Radiculopathy, cervical region: Secondary | ICD-10-CM | POA: Diagnosis not present

## 2017-04-10 DIAGNOSIS — M5412 Radiculopathy, cervical region: Secondary | ICD-10-CM | POA: Diagnosis not present

## 2017-04-10 DIAGNOSIS — M9901 Segmental and somatic dysfunction of cervical region: Secondary | ICD-10-CM | POA: Diagnosis not present

## 2017-04-10 DIAGNOSIS — M5020 Other cervical disc displacement, unspecified cervical region: Secondary | ICD-10-CM | POA: Diagnosis not present

## 2017-04-14 DIAGNOSIS — M9901 Segmental and somatic dysfunction of cervical region: Secondary | ICD-10-CM | POA: Diagnosis not present

## 2017-04-14 DIAGNOSIS — M5020 Other cervical disc displacement, unspecified cervical region: Secondary | ICD-10-CM | POA: Diagnosis not present

## 2017-04-14 DIAGNOSIS — M5412 Radiculopathy, cervical region: Secondary | ICD-10-CM | POA: Diagnosis not present

## 2017-04-16 DIAGNOSIS — M9901 Segmental and somatic dysfunction of cervical region: Secondary | ICD-10-CM | POA: Diagnosis not present

## 2017-04-16 DIAGNOSIS — M5020 Other cervical disc displacement, unspecified cervical region: Secondary | ICD-10-CM | POA: Diagnosis not present

## 2017-04-16 DIAGNOSIS — M5412 Radiculopathy, cervical region: Secondary | ICD-10-CM | POA: Diagnosis not present

## 2017-04-21 DIAGNOSIS — M5412 Radiculopathy, cervical region: Secondary | ICD-10-CM | POA: Diagnosis not present

## 2017-04-21 DIAGNOSIS — M5020 Other cervical disc displacement, unspecified cervical region: Secondary | ICD-10-CM | POA: Diagnosis not present

## 2017-04-21 DIAGNOSIS — M9901 Segmental and somatic dysfunction of cervical region: Secondary | ICD-10-CM | POA: Diagnosis not present

## 2017-05-09 DIAGNOSIS — M9901 Segmental and somatic dysfunction of cervical region: Secondary | ICD-10-CM | POA: Diagnosis not present

## 2017-05-09 DIAGNOSIS — M5412 Radiculopathy, cervical region: Secondary | ICD-10-CM | POA: Diagnosis not present

## 2017-05-09 DIAGNOSIS — M5020 Other cervical disc displacement, unspecified cervical region: Secondary | ICD-10-CM | POA: Diagnosis not present

## 2017-09-04 ENCOUNTER — Ambulatory Visit: Payer: 59 | Admitting: Family Medicine

## 2017-09-04 ENCOUNTER — Encounter: Payer: Self-pay | Admitting: Family Medicine

## 2017-09-04 ENCOUNTER — Ambulatory Visit (HOSPITAL_BASED_OUTPATIENT_CLINIC_OR_DEPARTMENT_OTHER)
Admission: RE | Admit: 2017-09-04 | Discharge: 2017-09-04 | Disposition: A | Discharge status: Home / Self Care | Payer: 59 | Source: Ambulatory Visit | Attending: Family Medicine | Admitting: Family Medicine

## 2017-09-04 VITALS — BP 122/86 | HR 74 | Ht 67.0 in | Wt 185.0 lb

## 2017-09-04 DIAGNOSIS — M7989 Other specified soft tissue disorders: Secondary | ICD-10-CM | POA: Insufficient documentation

## 2017-09-04 DIAGNOSIS — Y9302 Activity, running: Secondary | ICD-10-CM | POA: Insufficient documentation

## 2017-09-04 DIAGNOSIS — M25571 Pain in right ankle and joints of right foot: Secondary | ICD-10-CM | POA: Diagnosis not present

## 2017-09-04 DIAGNOSIS — X501XXA Overexertion from prolonged static or awkward postures, initial encounter: Secondary | ICD-10-CM | POA: Diagnosis not present

## 2017-09-04 DIAGNOSIS — S99911A Unspecified injury of right ankle, initial encounter: Secondary | ICD-10-CM

## 2017-09-04 DIAGNOSIS — S99911D Unspecified injury of right ankle, subsequent encounter: Secondary | ICD-10-CM | POA: Insufficient documentation

## 2017-09-04 NOTE — Assessment & Plan Note (Signed)
independently reviewed radiographs and no evidence fracture.  Performed and reviewed brief MSK u/s to confirm no occult fracture and peroneal tendons intact.  2/2 lateral sprain.  Icing, aleve or ibuprofen, elevation.  Cam walker and do home exercises which were reviewed.  Cross training if tolerated.  F/u in 2 weeks.

## 2017-09-04 NOTE — Patient Instructions (Signed)
You have an ankle sprain. Ice the area for 15 minutes at a time, 3-4 times a day Aleve 2 tabs twice a day with food OR ibuprofen 3 tabs three times a day with food for pain and inflammation - take for 7-10 days then as needed. Elevate above the level of your heart when possible Crutches if needed to help with walking Bear weight when tolerated Use brace/boot when up and walking around to help with stability while you recover from this injury. Come out of the brace/boot twice a day to do Up/down and alphabet exercises 2-3 sets of each. Consider physical therapy for strengthening and balance exercises in the future. If not improving as expected, we may repeat x-rays or consider further testing like an MRI. Follow up in 2 weeks. Ok for cycling, swimming if tolerated.  I suspect it will be 3-4 weeks before you feel comfortable enough to run.

## 2017-09-04 NOTE — Progress Notes (Signed)
PCP: Kathyrn Lass, MD  Subjective:   HPI: Patient is a 46 y.o. female here for right ankle injury.  [place] and inverted her right ankle. Immediate pain but able to jog a little and walk back to her car about 1 1/2 miles. She did feel a pop when inverted. Has been icing, elevating, wearing brace, taking advil. Pain level 4/10 but up to 8/10 and sharp laterally. Worse with walking and after night of sleep this morning. No skin changes, numbness.  Past Medical History:  Diagnosis Date  . Cancer ([place])   . Endometrial cancer ([place]) 2010    Current Outpatient Medications on File Prior to Visit  Medication Sig Dispense Refill  . amitriptyline (ELAVIL) 10 MG tablet      No current facility-administered medications on file prior to visit.     Past Surgical History:  Procedure Laterality Date  . ABDOMINAL HYSTERECTOMY    . BREAST LUMPECTOMY Right 2007  . BREAST SURGERY     lumpectomy   . HYSTEROSCOPY    . MYOMECTOMY  2008  . OOPHORECTOMY Left 2010  . PELVIC LAPAROSCOPY      No Known Allergies  Social History   Socioeconomic History  . Marital status: Married    Spouse name: Not on file  . Number of children: Not on file  . Years of education: Not on file  . Highest education level: Not on file  Social Needs  . Financial resource strain: Not on file  . Food insecurity - worry: Not on file  . Food insecurity - inability: Not on file  . Transportation needs - medical: Not on file  . Transportation needs - non-medical: Not on file  Occupational History  . Not on file  Tobacco Use  . Smoking status: Never Smoker  . Smokeless tobacco: Never Used  Substance and Sexual Activity  . Alcohol use: Yes    Alcohol/week: 1.2 - 1.8 oz    Types: 2 - 3 Standard drinks or equivalent per week  . Drug use: No  . Sexual activity: Yes    Partners: Male    Birth control/protection: Surgical  Other Topics  Concern  . Not on file  Social History Narrative  . Not on file    Family History  Problem Relation Age of Onset  . Hypertension Mother   . Hypertension Father   . Rheum arthritis Father   . Diabetes Neg Hx   . Heart attack Neg Hx   . Hyperlipidemia Neg Hx   . Sudden death Neg Hx     BP 122/86   Pulse 74   Ht 5\' 7"  (1.702 m)   Wt 185 lb (83.9 kg)   BMI 28.98 kg/m   Review of Systems: See HPI above.     Objective:  Physical Exam:  Gen: NAD, comfortable in exam room  Right ankle: Swelling laterally over lateral malleolus, ATFL.  No other deformity, bruising, swelling. Mod limitation ROM all directions.  Strength 5/5 all directions. TTP over lateral malleolus, ATFL, peroneal tendons.  No medial malleolus, base 5th, navicular, other tenderness. 1+ ant drawer and talar tilt. Negative syndesmotic compression. Thompsons test negative. NV intact distally.  Left ankle: FROM without pain.   Assessment & Plan:  1. Right ankle injury - independently reviewed radiographs and no evidence fracture.  Performed and reviewed brief MSK u/s to confirm no occult fracture and peroneal tendons intact.  2/2  lateral sprain.  Icing, aleve or ibuprofen, elevation.  Cam walker and do home exercises which were reviewed.  Cross training if tolerated.  F/u in 2 weeks.

## 2017-09-18 ENCOUNTER — Ambulatory Visit: Payer: 59 | Admitting: Family Medicine

## 2017-09-18 ENCOUNTER — Encounter: Payer: Self-pay | Admitting: Family Medicine

## 2017-09-18 VITALS — BP 109/73 | HR 68 | Ht 67.0 in | Wt 184.0 lb

## 2017-09-18 DIAGNOSIS — S99911D Unspecified injury of right ankle, subsequent encounter: Secondary | ICD-10-CM | POA: Diagnosis not present

## 2017-09-18 NOTE — Progress Notes (Signed)
PCP: Kathyrn Lass, MD  Subjective:   HPI: Patient is a 46 y.o. female here for right ankle injury.  11/15: [place] and inverted her right ankle. Immediate pain but able to jog a little and walk back to her car about 1 1/2 miles. She did feel a pop when inverted. Has been icing, elevating, wearing brace, taking advil. Pain level 4/10 but up to 8/10 and sharp laterally. Worse with walking and after night of sleep this morning. No skin changes, numbness.  11/29: Patient reports she is making progress with her right ankle. Pain level is 2/10 but up to 6/10 and sharp at times laterally. She work the boot for about a week and switched to an ASO which she is wearing today. Doing home exercises. Was able to bike yesterday for the first time. No skin changes, swelling has improved.  Past Medical History:  Diagnosis Date  . Cancer ([place])   . Endometrial cancer ([place]) 2010    Current Outpatient Medications on File Prior to Visit  Medication Sig Dispense Refill  . amitriptyline (ELAVIL) 10 MG tablet      No current facility-administered medications on file prior to visit.     Past Surgical History:  Procedure Laterality Date  . ABDOMINAL HYSTERECTOMY    . BREAST LUMPECTOMY Right 2007  . BREAST SURGERY     lumpectomy   . HYSTEROSCOPY    . MYOMECTOMY  2008  . OOPHORECTOMY Left 2010  . PELVIC LAPAROSCOPY      No Known Allergies  Social History   Socioeconomic History  . Marital status: Married    Spouse name: Not on file  . Number of children: Not on file  . Years of education: Not on file  . Highest education level: Not on file  Social Needs  . Financial resource strain: Not on file  . Food insecurity - worry: Not on file  . Food insecurity - inability: Not on file  . Transportation needs - medical: Not on file  . Transportation needs - non-medical: Not on file  Occupational History  . Not  on file  Tobacco Use  . Smoking status: Never Smoker  . Smokeless tobacco: Never Used  Substance and Sexual Activity  . Alcohol use: Yes    Alcohol/week: 1.2 - 1.8 oz    Types: 2 - 3 Standard drinks or equivalent per week  . Drug use: No  . Sexual activity: Yes    Partners: Male    Birth control/protection: Surgical  Other Topics Concern  . Not on file  Social History Narrative  . Not on file    Family History  Problem Relation Age of Onset  . Hypertension Mother   . Hypertension Father   . Rheum arthritis Father   . Diabetes Neg Hx   . Heart attack Neg Hx   . Hyperlipidemia Neg Hx   . Sudden death Neg Hx     BP 109/73   Pulse 68   Ht 5\' 7"  (1.702 m)   Wt 184 lb (83.5 kg)   BMI 28.82 kg/m   Review of Systems: See HPI above.     Objective:  Physical Exam:  Gen: NAD, comfortable in exam room.  Right ankle: Mild lateral swelling.  No other deformity, ecchymoses. FROM with 5/5 strength. TTP over ATFL.  No malleolar, base 5th, navicular, other tenderness. 1+ ant drawer and talar tilt.  Negative syndesmotic compression.  Thompsons test negative. NV intact distally.   Assessment & Plan:  1. Right ankle injury - clinically improving from lateral ankle sprain.  Radiographs negative as well as ultrasound.  Continue ASO.  Will start physical therapy and home exercises.  Icing, ibuprofen or aleve if needed.  F/u in 4 weeks.

## 2017-09-18 NOTE — Patient Instructions (Signed)
You have an ankle sprain. Ice the area for 15 minutes at a time, 3-4 times a day as needed Aleve or ibuprofen only if needed. Use brace when up and walking around for next 4 weeks. Start physical therapy for strengthening and balance exercises at the [place] location. Follow up in 4 weeks. I suspect it'll be another 1-2 weeks before you'll be able to run.

## 2017-09-18 NOTE — Assessment & Plan Note (Signed)
clinically improving from lateral ankle sprain.  Radiographs negative as well as ultrasound.  Continue ASO.  Will start physical therapy and home exercises.  Icing, ibuprofen or aleve if needed.  F/u in 4 weeks.

## 2017-09-22 ENCOUNTER — Ambulatory Visit: Payer: 59 | Admitting: Obstetrics and Gynecology

## 2017-09-24 ENCOUNTER — Ambulatory Visit: Payer: 59 | Attending: Family Medicine | Admitting: Physical Therapy

## 2017-09-24 ENCOUNTER — Encounter: Payer: Self-pay | Admitting: Physical Therapy

## 2017-09-24 DIAGNOSIS — M25671 Stiffness of right ankle, not elsewhere classified: Secondary | ICD-10-CM

## 2017-09-24 DIAGNOSIS — R600 Localized edema: Secondary | ICD-10-CM

## 2017-09-24 DIAGNOSIS — M25571 Pain in right ankle and joints of right foot: Secondary | ICD-10-CM

## 2017-09-24 DIAGNOSIS — R2681 Unsteadiness on feet: Secondary | ICD-10-CM | POA: Diagnosis not present

## 2017-09-24 NOTE — Patient Instructions (Signed)
   Leg elevation with ice  x15 min, atleast 2 times day       Seated calf raises  Sit towards the edge of a chair or bed with feet flat on the floor. Lift your heels up so that you are on your toes. Return to start and repeat.    x20 reps      SEATED CALF STRETCH - GASTROCNEMIUS  While sitting, use a towel or other strap looped around your foot. Gently pull your ankle back until a stretch is felt along the back of your lower leg. Maintain your target knee straight the entire time. Hold 30 sec, repeat 3x (each leg)      ANKLE ABC's   While in a seated position, write out the alphabet in the air with your big toe.  Your ankle should be moving as you perform this.  x3 rounds   [facility] [address], [place], [place] [address] Phone # [phone] Fax [phone]

## 2017-09-25 ENCOUNTER — Ambulatory Visit: Payer: 59 | Admitting: Physical Therapy

## 2017-09-25 NOTE — Therapy (Signed)
[facility] [address], [place], [place], [address] Phone: [phone]   Fax:  [phone]  Physical Therapy Evaluation  Patient Details  Name: Joanna Barker MRN: [id number] Date of Birth: 25-Apr-1971 Referring Provider: Karlton Lemon, MD    Encounter Date: 09/24/2017  PT End of Session - 09/24/17 1611    Visit Number  1    Number of Visits  17    Date for PT Re-Evaluation  11/19/17    Authorization Type  [employer] healthcare    Authorization Time Period  09/24/17 to 11/19/17    PT Start Time  1530    PT Stop Time  1610    PT Time Calculation (min)  40 min    Equipment Utilized During Treatment  Other (comment) ankle brace     Activity Tolerance  No increased pain;Patient tolerated treatment well    Behavior During Therapy  [facility] for tasks assessed/performed       Past Medical History:  Diagnosis Date  . Cancer ([place])   . Endometrial cancer ([place]) 2010    Past Surgical History:  Procedure Laterality Date  . ABDOMINAL HYSTERECTOMY    . BREAST LUMPECTOMY Right 2007  . BREAST SURGERY     lumpectomy   . HYSTEROSCOPY    . MYOMECTOMY  2008  . OOPHORECTOMY Left 2010  . PELVIC LAPAROSCOPY      There were no vitals filed for this visit.   Subjective Assessment - 09/24/17 1533    Subjective  Pt reports that 3 weeks ago, she was running with a group along the [place]. It was dark and had just rained, so she hit a cut-out and rolled her ankle. She heard a pop and tried to jog another half mile which was difficult. She had to walk the rest of the way. She went to the the [place] prior to her trip to [place] and wore a boot while there. She is now wearing an ankle brace, and she does not take it off because she wants it to heal. She is trying to run a half marathon in January and is hoping to be able to do this and run the whole thing.  Pt reports that she has sprained her ankle in the past. She had bruising along the top  and lateral aspect of her foot following the initial injury. It is gradually improving.    Pertinent History  n/a     Limitations  Walking;Other (comment) running     How long can you sit comfortably?  unlimited     How long can you stand comfortably?  unlimited    How long can you walk comfortably?  up to 1 mile walking her dog     Diagnostic tests  musculoskeletal ultrasound negative; X-ray negative    Patient Stated Goals  run her half marathon in January 18th    Currently in Pain?  Yes    Pain Score  3     Pain Location  Ankle    Pain Orientation  Right;Lateral    Pain Descriptors / Indicators  Dull;Aching    Pain Radiating Towards  none     Pain Onset  1 to 4 weeks ago    Pain Frequency  Intermittent    Aggravating Factors   alot of squatting, jogging, inversion and plantarflexion    Pain Relieving Factors  ice, rest, brace     Effect of Pain on Daily Activities  unable to complete regular activity  and recreation     Multiple Pain Sites  No         [facility] PT Assessment - 09/25/17 0001      Assessment   Medical Diagnosis  Rt ankle injury, subsequent encounter     Referring Provider  Karlton Lemon, MD     Onset Date/Surgical Date  -- 3 weeks ago     Next MD Visit  10/21/17    Prior Therapy  none       Precautions   Precautions  None      Balance Screen   Has the patient fallen in the past 6 months  Yes    How many times?  1    Has the patient had a decrease in activity level because of a fear of falling?   No    Is the patient reluctant to leave their home because of a fear of falling?   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       Cognition   Overall Cognitive Status  Within Functional Limits for tasks assessed      Observation/Other Assessments   Observations  Pt wearing lace-up  ankle brace      AROM   Right Ankle Dorsiflexion  0    Right Ankle Plantar Flexion  40    Right Ankle Inversion  20    Right Ankle Eversion  10    Left Ankle Dorsiflexion  5    Left Ankle Plantar Flexion  45    Left Ankle Inversion  30    Left Ankle Eversion  10      Strength   Right Ankle Dorsiflexion  4+/5    Right Ankle Plantar Flexion  -- x10 single leg heel raises     Right Ankle Inversion  4+/5    Right Ankle Eversion  4+/5    Left Ankle Dorsiflexion  5/5    Left Ankle Plantar Flexion  5/5    Left Ankle Inversion  5/5    Left Ankle Eversion  5/5      Palpation   Palpation comment  tenderness along Rt lateral aspect of the foot, ATF region,       Ambulation/Gait   Gait Comments  Pt ambulating with decreased push off on the Rt and decreased step length on the Lt; Therapist providing cuing to improve heel strike and push off on the Rt, educating pt on importance of using proper mechanics      High Level Balance   High Level Balance Comments  SLS eyes open/firm: 30 sec each; Eyes open/foam: Lt 20 sec, 4 sec; eyes closed/firm: 10 sec Lt, 8 sec on Rt              Objective measurements completed on examination: See above findings.      [place] [facility] PT Treatment/Exercise - 09/25/17 0001      Self-Care   Self-Care  RICE    RICE  discussed parameters of ice/elevation/compression throughout the day       Exercises   Exercises  Ankle      Ankle Exercises: Seated   ABC's  1 rep    ABC's Limitations  Rt ankle     Heel Raises  20 reps  PT Education - 09/25/17 0739    Education provided  Yes    Education Details  how ankle injuries impact proprioception; eval findings/POC; importance of ice/elevation throughout the day to address swelling; proper gait mechanics     Person(s) Educated  Patient    Methods  Demonstration;Explanation;Verbal cues;Handout    Comprehension  Verbalized understanding;Returned demonstration       PT Short Term Goals -  09/24/17 1613      PT SHORT TERM GOAL #1   Title  Pt will demo consistency and independence with her HEP to decrease ankle pain and swelling.     Time  4    Period  Weeks    Status  New    Target Date  10/22/17      PT SHORT TERM GOAL #2   Title  Pt will demo improved Rt ankle active dorsiflexion ROM to atleast 10 deg, which will assist with foot clearance during ambulation.     Time  4    Period  Weeks    Status  New      PT SHORT TERM GOAL #3   Title  Pt will ambulate throughout her session with proper heel strike, step length and push off on the Rt, without therapist cuing, to decrease the risk of other compensations forming.     Time  4    Period  Weeks    Status  New        PT Long Term Goals - 09/24/17 1615      PT LONG TERM GOAL #1   Title  Pt will demo improved Rt ankle plantar flexion strength evident by her ability to complete atleast 20 single leg heel raises, to assist with return to running.     Time  6    Period  Weeks    Status  New    Target Date  11/05/17      PT LONG TERM GOAL #2   Title  Pt will demo anterior reach on the Rt within 4cm of the Lt in order to reflect improved symmetry between LEs for return to jogging.     Time  8    Period  Weeks    Status  New      PT LONG TERM GOAL #3   Title  Pt will maintain BLE single leg stance on a foam pad/eyes open for atleast 15 sec (2/3 trials) without significant postural sway, in order to reflect an improvement in her ankle proprioception and stability.     Time  8    Period  Weeks    Status  New      PT LONG TERM GOAL #4   Title  Pt will report atleast 75% improvement in ankle pain and stiffness with daily activity from the start of PT, to increase her confidence with walking around her home/work without an ankle brace support.     Time  8    Period  Weeks    Status  New      PT LONG TERM GOAL #5   Title  Pt will return to jogging atleast 50% of her prior distance with no more than 3/10 discomfort or  pain, in order to help facilitate her return to trianing for races.     Time  8    Period  Weeks    Status  New             Plan - 09/24/17 1612  Clinical Impression Statement  Pt is an active 46 y.o F referred to OPPT after rolling her ankle on a night run 3 weeks ago. Xray and musculoskeletal ultrasound imaging was negative for fracture or ligament tears and she is currently wearing a lace up ankle brace for additional support. She presents today with some residual bruising along the Rt lateral ankle/foot, edema and pain with end range stretching into inversion and plantar flexion. She also has limitations in Rt ankle dorsiflexion and inversion, as well as decreased proprioception on the Rt. She ambulates with decreased push off on the Rt, and this was addressed during today's session. She is scheduled to run a half marathon in January and would benefit from skilled PT to address her limitations in ROM, pain, strength and proprioception in order to get her back to her regular exercise routine and possibly run her race in January.    History and Personal Factors relevant to plan of care:  history of ankle sprains in the past    Clinical Presentation  Evolving    Clinical Presentation due to:  gradually improving     Clinical Decision Making  Low    Rehab Potential  Good    PT Frequency  2x / week    PT Duration  8 weeks    PT Treatment/Interventions  ADLs/Self Care Home Management;Cryotherapy;Electrical Stimulation;Ultrasound;Moist Heat;Therapeutic activities;Therapeutic exercise;Functional mobility training;Stair training;Gait training;Balance training;Neuromuscular re-education;Orthotic Fit/Training;Patient/family education;Manual techniques;Passive range of motion;Dry needling;Taping    PT Next Visit Plan  progression of Rt ankle ROM (inversion/DF); ankle mobilizations; provide ankle 4 way TB if able    Consulted and Agree with Plan of Care  Patient       Patient will benefit from  skilled therapeutic intervention in order to improve the following deficits and impairments:  Abnormal gait, Decreased activity tolerance, Decreased balance, Difficulty walking, Increased edema, Impaired flexibility, Hypomobility, Decreased strength, Decreased endurance, Decreased range of motion, Decreased mobility, Pain, Improper body mechanics  Visit Diagnosis: Pain in right ankle and joints of right foot  Stiffness of right ankle, not elsewhere classified  Unsteadiness on feet  Localized edema     Problem List Patient Active Problem List   Diagnosis Date Noted  . Right ankle injury, subsequent encounter 09/04/2017  . Plantar fasciitis of right foot 10/03/2016  . History of endometrial cancer 09/17/2016  . Left knee injury 06/27/2015  . Anxiety 09/21/2014  . Right knee pain 11/30/2013  . ABDOMINAL BLOATING 10/12/2010  . ABDOMINAL PAIN OTHER SPECIFIED SITE 09/28/2010  . NAUSEA ALONE 07/04/2010    7:57 AM,09/25/17 Elly Modena PT, DPT [facility] [address], [place], [place], [address] Phone: [phone]   Fax:  [phone]  Name: Joanna Barker MRN: [id number] Date of Birth: 1971/08/24

## 2017-09-26 ENCOUNTER — Encounter: Payer: Self-pay | Admitting: Obstetrics and Gynecology

## 2017-09-26 ENCOUNTER — Ambulatory Visit (INDEPENDENT_AMBULATORY_CARE_PROVIDER_SITE_OTHER): Payer: 59 | Admitting: Obstetrics and Gynecology

## 2017-09-26 ENCOUNTER — Other Ambulatory Visit: Payer: Self-pay

## 2017-09-26 VITALS — BP 128/78 | HR 72 | Resp 14 | Ht 67.0 in | Wt 186.0 lb

## 2017-09-26 DIAGNOSIS — Z8542 Personal history of malignant neoplasm of other parts of uterus: Secondary | ICD-10-CM

## 2017-09-26 DIAGNOSIS — Z01419 Encounter for gynecological examination (general) (routine) without abnormal findings: Secondary | ICD-10-CM

## 2017-09-26 DIAGNOSIS — R109 Unspecified abdominal pain: Secondary | ICD-10-CM

## 2017-09-26 DIAGNOSIS — Z23 Encounter for immunization: Secondary | ICD-10-CM

## 2017-09-26 DIAGNOSIS — Z0000 Encounter for general adult medical examination without abnormal findings: Secondary | ICD-10-CM

## 2017-09-26 DIAGNOSIS — R102 Pelvic and perineal pain: Secondary | ICD-10-CM

## 2017-09-26 NOTE — Patient Instructions (Signed)
EXERCISE AND DIET:  We recommended that you start or continue a regular exercise program for good health. Regular exercise means any activity that makes your heart beat faster and makes you sweat.  We recommend exercising at least 30 minutes per day at least 3 days a week, preferably 4 or 5.  We also recommend a diet low in fat and sugar.  Inactivity, poor dietary choices and obesity can cause diabetes, heart attack, stroke, and kidney damage, among others.    ALCOHOL AND SMOKING:  Women should limit their alcohol intake to no more than 7 drinks/beers/glasses of wine (combined, not each!) per week. Moderation of alcohol intake to this level decreases your risk of breast cancer and liver damage. And of course, no recreational drugs are part of a healthy lifestyle.  And absolutely no smoking or even second hand smoke. Most people know smoking can cause heart and lung diseases, but did you know it also contributes to weakening of your bones? Aging of your skin?  Yellowing of your teeth and nails?  CALCIUM AND VITAMIN D:  Adequate intake of calcium and Vitamin D are recommended.  The recommendations for exact amounts of these supplements seem to change often, but generally speaking 600 mg of calcium (either carbonate or citrate) and 800 units of Vitamin D per day seems prudent. Certain women may benefit from higher intake of Vitamin D.  If you are among these women, your doctor will have told you during your visit.    PAP SMEARS:  Pap smears, to check for cervical cancer or precancers,  have traditionally been done yearly, although recent scientific advances have shown that most women can have pap smears less often.  However, every woman still should have a physical exam from her gynecologist every year. It will include a breast check, inspection of the vulva and vagina to check for abnormal growths or skin changes, a visual exam of the cervix, and then an exam to evaluate the size and shape of the uterus and  ovaries.  And after 44 years of age, a rectal exam is indicated to check for rectal cancers. We will also provide age appropriate advice regarding health maintenance, like when you should have certain vaccines, screening for sexually transmitted diseases, bone density testing, colonoscopy, mammograms, etc.   MAMMOGRAMS:  All women over 40 years old should have a yearly mammogram. Many facilities now offer a "3D" mammogram, which may cost around $50 extra out of pocket. If possible,  we recommend you accept the option to have the 3D mammogram performed.  It both reduces the number of women who will be called back for extra views which then turn out to be normal, and it is better than the routine mammogram at detecting truly abnormal areas.    COLONOSCOPY:  Colonoscopy to screen for colon cancer is recommended for all women at age 50.  We know, you hate the idea of the prep.  We agree, BUT, having colon cancer and not knowing it is worse!!  Colon cancer so often starts as a polyp that can be seen and removed at colonscopy, which can quite literally save your life!  And if your first colonoscopy is normal and you have no family history of colon cancer, most women don't have to have it again for 10 years.  Once every ten years, you can do something that may end up saving your life, right?  We will be happy to help you get it scheduled when you are ready.    Be sure to check your insurance coverage so you understand how much it will cost.  It may be covered as a preventative service at no cost, but you should check your particular policy.      Breast Self-Awareness Breast self-awareness means being familiar with how your breasts look and feel. It involves checking your breasts regularly and reporting any changes to your health care provider. Practicing breast self-awareness is important. A change in your breasts can be a sign of a serious medical problem. Being familiar with how your breasts look and feel allows  you to find any problems early, when treatment is more likely to be successful. All women should practice breast self-awareness, including women who have had breast implants. How to do a breast self-exam One way to learn what is normal for your breasts and whether your breasts are changing is to do a breast self-exam. To do a breast self-exam: Look for Changes  1. Remove all the clothing above your waist. 2. Stand in front of a mirror in a room with good lighting. 3. Put your hands on your hips. 4. Push your hands firmly downward. 5. Compare your breasts in the mirror. Look for differences between them (asymmetry), such as: ? Differences in shape. ? Differences in size. ? Puckers, dips, and bumps in one breast and not the other. 6. Look at each breast for changes in your skin, such as: ? Redness. ? Scaly areas. 7. Look for changes in your nipples, such as: ? Discharge. ? Bleeding. ? Dimpling. ? Redness. ? A change in position. Feel for Changes  Carefully feel your breasts for lumps and changes. It is best to do this while lying on your back on the floor and again while sitting or standing in the shower or tub with soapy water on your skin. Feel each breast in the following way:  Place the arm on the side of the breast you are examining above your head.  Feel your breast with the other hand.  Start in the nipple area and make  inch (2 cm) overlapping circles to feel your breast. Use the pads of your three middle fingers to do this. Apply light pressure, then medium pressure, then firm pressure. The light pressure will allow you to feel the tissue closest to the skin. The medium pressure will allow you to feel the tissue that is a little deeper. The firm pressure will allow you to feel the tissue close to the ribs.  Continue the overlapping circles, moving downward over the breast until you feel your ribs below your breast.  Move one finger-width toward the center of the body.  Continue to use the  inch (2 cm) overlapping circles to feel your breast as you move slowly up toward your collarbone.  Continue the up and down exam using all three pressures until you reach your armpit.  Write Down What You Find  Write down what is normal for each breast and any changes that you find. Keep a written record with breast changes or normal findings for each breast. By writing this information down, you do not need to depend only on memory for size, tenderness, or location. Write down where you are in your menstrual cycle, if you are still menstruating. If you are having trouble noticing differences in your breasts, do not get discouraged. With time you will become more familiar with the variations in your breasts and more comfortable with the exam. How often should I examine my breasts? Examine   your breasts every month. If you are breastfeeding, the best time to examine your breasts is after a feeding or after using a breast pump. If you menstruate, the best time to examine your breasts is 5-7 days after your period is over. During your period, your breasts are lumpier, and it may be more difficult to notice changes. When should I see my health care provider? See your health care provider if you notice:  A change in shape or size of your breasts or nipples.  A change in the skin of your breast or nipples, such as a reddened or scaly area.  Unusual discharge from your nipples.  A lump or thick area that was not there before.  Pain in your breasts.  Anything that concerns you.  This information is not intended to replace advice given to you by your health care provider. Make sure you discuss any questions you have with your health care provider. Document Released: 10/07/2005 Document Revised: 03/14/2016 Document Reviewed: 08/27/2015 Elsevier Interactive Patient Education  2018 Elsevier Inc.  

## 2017-09-26 NOTE — Progress Notes (Signed)
46 y.o. G0P0000 MarriedCaucasianF here for annual exam. Patient c/o RLQ pain.  She c/o a several month h/o intermittent RLQ abdominal/pelvic pain. First noted it after running, can occur at other times. It's a mild ache, lasts for a couple of hours. Can go weeks without it. Normal BM, normal bladder function. Sexually active, no pain.     h/o "Stage I-A, Grade 1 adenocarcinoma of the endometrium status post elective laparoscopic hysterectomy on 5/10 with preservation of the right ovary and fallopian tube". They had been trying to get pregnant and as part of the w/u was diagnosed with hyperplasia, treated with provera, still progressed to endometrial cancer.   No LMP recorded. Patient has had a hysterectomy.          Sexually active: Yes.    The current method of family planning is status post hysterectomy.    Exercising: Yes.    running/weights Smoker:  no  Health Maintenance: Pap:  2010  History of abnormal Pap:  no MMG:  06/2017 WNL per patient Teola Bradley)  Colonoscopy:  Never BMD:   Never TDaP:  unsure Gardasil: no   reports that  has never smoked. she has never used smokeless tobacco. She reports that she drinks about 1.8 - 2.4 oz of alcohol per week. She reports that she does not use drugs. Works at [employer] firm in [place].   Past Medical History:  Diagnosis Date  . Cancer ([place])   . Endometrial cancer ([place]) 2010    Past Surgical History:  Procedure Laterality Date  . ABDOMINAL HYSTERECTOMY    . BREAST LUMPECTOMY Right 2007  . BREAST SURGERY     lumpectomy   . HYSTEROSCOPY    . MYOMECTOMY  2008  . OOPHORECTOMY Left 2010  . PELVIC LAPAROSCOPY      No current outpatient medications on file.   No current facility-administered medications for this visit.     Family History  Problem Relation Age of Onset  . Hypertension Mother   . Hypertension Father   . Rheum arthritis Father   . Diabetes Neg Hx   . Heart attack Neg Hx   . Hyperlipidemia Neg Hx   . Sudden death  Neg Hx     Review of Systems  Constitutional: Negative.   HENT: Negative.   Eyes: Negative.   Respiratory: Negative.   Cardiovascular: Negative.   Gastrointestinal: Negative.   Endocrine: Negative.   Genitourinary:       RLQ pain  Musculoskeletal: Negative.   Skin: Negative.   Allergic/Immunologic: Negative.   Neurological: Negative.   Psychiatric/Behavioral: Negative.     Exam:   BP 128/78 (BP Location: Right Arm, Patient Position: Sitting, Cuff Size: Normal)   Pulse 72   Resp 14   Ht 5\' 7"  (1.702 m)   Wt 186 lb (84.4 kg)   BMI 29.13 kg/m   Weight change: @WEIGHTCHANGE @ Height:   Height: 5\' 7"  (170.2 cm)  Ht Readings from Last 3 Encounters:  09/26/17 5\' 7"  (1.702 m)  09/18/17 5\' 7"  (1.702 m)  09/04/17 5\' 7"  (1.702 m)    General appearance: alert, cooperative and appears stated age Head: Normocephalic, without obvious abnormality, atraumatic Neck: no adenopathy, supple, symmetrical, trachea midline and thyroid normal to inspection and palpation Lungs: clear to auscultation bilaterally Cardiovascular: regular rate and rhythm Breasts: normal appearance, no masses or tenderness Abdomen: soft, non-tender; non distended,  no masses,  no organomegaly Extremities: extremities normal, atraumatic, no cyanosis or edema Skin: Skin color, texture, turgor normal. No  rashes or lesions Lymph nodes: Cervical, supraclavicular, and axillary nodes normal. No abnormal inguinal nodes palpated Neurologic: Grossly normal   Pelvic: External genitalia:  no lesions              Urethra:  normal appearing urethra with no masses, tenderness or lesions              Bartholins and Skenes: normal                 Vagina: normal appearing vagina with normal color and discharge, no lesions              Cervix: absent               Bimanual Exam:  Uterus:  uterus absent              Adnexa: no mass, fullness, tenderness               Rectovaginal: Confirms               Anus:  normal sphincter  tone, no lesions  Pelvic floor: not tender  Chaperone was present for exam.  A:  Well Woman with normal exam  RLQ abdominal/pelvic pain  H/O endometrial cancer, had TLH/LSO  P:   No pap   Screening labs  UTD mammogram  Discussed breast self exam  Discussed calcium and vit D intake  Return for GYN ultrasound.

## 2017-09-27 LAB — LIPID PANEL
CHOL/HDL RATIO: 2.3 ratio (ref 0.0–4.4)
Cholesterol, Total: 139 mg/dL (ref 100–199)
HDL: 61 mg/dL (ref >39)
LDL CALC: 71 mg/dL (ref 0–99)
Triglycerides: 37 mg/dL (ref 0–149)
VLDL CHOLESTEROL CAL: 7 mg/dL (ref 5–40)

## 2017-09-27 LAB — COMPREHENSIVE METABOLIC PANEL
ALBUMIN: 4.3 g/dL (ref 3.5–5.5)
ALT: 14 IU/L (ref 0–32)
AST: 14 IU/L (ref 0–40)
Albumin/Globulin Ratio: 1.8 (ref 1.2–2.2)
Alkaline Phosphatase: 29 IU/L — ABNORMAL LOW (ref 39–117)
BUN / CREAT RATIO: 16 (ref 9–23)
BUN: 13 mg/dL (ref 6–24)
Bilirubin Total: 0.3 mg/dL (ref 0.0–1.2)
CALCIUM: 9.1 mg/dL (ref 8.7–10.2)
CO2: 25 mmol/L (ref 20–29)
Chloride: 102 mmol/L (ref 96–106)
Creatinine, Ser: 0.79 mg/dL (ref 0.57–1.00)
GFR, EST AFRICAN AMERICAN: 105 mL/min/{1.73_m2} (ref >59)
GFR, EST NON AFRICAN AMERICAN: 91 mL/min/{1.73_m2} (ref >59)
GLUCOSE: 91 mg/dL (ref 65–99)
Globulin, Total: 2.4 g/dL (ref 1.5–4.5)
Potassium: 4.2 mmol/L (ref 3.5–5.2)
Sodium: 140 mmol/L (ref 134–144)
TOTAL PROTEIN: 6.7 g/dL (ref 6.0–8.5)

## 2017-09-27 LAB — CBC
HEMOGLOBIN: 13.1 g/dL (ref 11.1–15.9)
Hematocrit: 39.5 % (ref 34.0–46.6)
MCH: 31 pg (ref 26.6–33.0)
MCHC: 33.2 g/dL (ref 31.5–35.7)
MCV: 94 fL (ref 79–97)
PLATELETS: 290 10*3/uL (ref 150–379)
RBC: 4.22 x10E6/uL (ref 3.77–5.28)
RDW: 13.3 % (ref 12.3–15.4)
WBC: 6.3 10*3/uL (ref 3.4–10.8)

## 2017-09-30 ENCOUNTER — Ambulatory Visit: Payer: 59 | Admitting: Physical Therapy

## 2017-09-30 DIAGNOSIS — M25571 Pain in right ankle and joints of right foot: Secondary | ICD-10-CM

## 2017-09-30 DIAGNOSIS — M25671 Stiffness of right ankle, not elsewhere classified: Secondary | ICD-10-CM

## 2017-09-30 DIAGNOSIS — R600 Localized edema: Secondary | ICD-10-CM

## 2017-09-30 DIAGNOSIS — R2681 Unsteadiness on feet: Secondary | ICD-10-CM

## 2017-09-30 NOTE — Patient Instructions (Signed)
   Inversion: Resisted   Cross legs with right leg underneath, foot in tubing loop. Hold tubing around other foot to resist and turn foot in. Repeat _20___ times per set. Do __2__ sets per session. Do _1-2___ sessions per day.  http://orth.exer.us/12   Copyright  VHI. All rights reserved.  Eversion: Resisted   With right foot in tubing loop, hold tubing around other foot to resist and turn foot out. Repeat __20__ times per set. Do __2__ sets per session. Do __2_ sessions per day.  http://orth.exer.us/14   Copyright  VHI. All rights reserved.  Plantar Flexion: Resisted   Anchor behind, tubing around left foot, press down. Repeat __20__ times per set. Do __2__ sets per session. Do __2__ sessions per day.  http://orth.exer.us/10   Copyright  VHI. All rights reserved.  Dorsiflexion: Resisted   Facing anchor, tubing around left foot, pull toward face.  Repeat _20___ times per set. Do __2__ sets per session. Do _2___ sessions per day.  http://orth.exer.us/8   Copyright  VHI. All rights reserved.    Ruben Im PT [facility] [address] [place], [place] [address] Phone # [phone] Fax [phone]

## 2017-09-30 NOTE — Therapy (Signed)
[facility] [address], [place], [place], [address] Phone: [phone]   Fax:  [phone]  Physical Therapy Treatment  Patient Details  Name: Joanna Barker MRN: [id number] Date of Birth: December 18, 1970 Referring Provider: Karlton Lemon, MD    Encounter Date: 09/30/2017  PT End of Session - 09/30/17 1358    Visit Number  2    Number of Visits  17    Date for PT Re-Evaluation  11/19/17    Authorization Type  [employer]    Authorization Time Period  09/24/17 to 11/19/17    PT Start Time  1150    PT Stop Time  1230    PT Time Calculation (min)  40 min    Activity Tolerance  Patient tolerated treatment well       Past Medical History:  Diagnosis Date  . Cancer ([place])   . Endometrial cancer ([place]) 2010    Past Surgical History:  Procedure Laterality Date  . ABDOMINAL HYSTERECTOMY    . BREAST LUMPECTOMY Right 2007  . BREAST SURGERY     lumpectomy   . HYSTEROSCOPY    . MYOMECTOMY  2008  . OOPHORECTOMY Left 2010  . PELVIC LAPAROSCOPY      There were no vitals filed for this visit.  Subjective Assessment - 09/30/17 1155    Subjective  Feeling better.  Wearing brace during the daytime.  Decreased swelling.  Rode bike for 30 min at the gym on Saturday.  Did Elliptical for 10 min on Saturday.  Shoveled snow and a little sore from that today.  Descending steps still difficult.      Currently in Pain?  Yes    Pain Score  2     Pain Location  Ankle    Pain Orientation  Right    Pain Type  Acute pain    Aggravating Factors   walking; descending stairs                      [facility] Adult PT Treatment/Exercise - 09/30/17 0001      Manual Therapy   Manual therapy comments  standing mob with movement with belt 2x 15    Joint Mobilization  grade 1&2 5x 10 sec each: talocrural; dorsiflexion, inversion,eversion; prone dorsiflexion 10x      Ankle Exercises: Stretches   Slant Board Stretch  3 reps;20  seconds      Ankle Exercises: Standing   BAPS  Sitting;Level 2;15 reps    Other Standing Ankle Exercises  WB on right 4 ways 2x 10      Ankle Exercises: Seated   Other Seated Ankle Exercises  Green band 10x 4 ways      Ankle Exercises: Aerobic   Stationary Bike  6 min L 3             PT Education - 09/30/17 1357    Education provided  Yes    Education Details  green band dorsiflexion, plantarflexion, inversions, eversion    Person(s) Educated  Patient    Methods  Explanation;Demonstration;Handout    Comprehension  Verbalized understanding;Returned demonstration       PT Short Term Goals - 09/24/17 1613      PT SHORT TERM GOAL #1   Title  Pt will demo consistency and independence with her HEP to decrease ankle pain and swelling.     Time  4    Period  Weeks    Status  New  Target Date  10/22/17      PT SHORT TERM GOAL #2   Title  Pt will demo improved Rt ankle active dorsiflexion ROM to atleast 10 deg, which will assist with foot clearance during ambulation.     Time  4    Period  Weeks    Status  New      PT SHORT TERM GOAL #3   Title  Pt will ambulate throughout her session with proper heel strike, step length and push off on the Rt, without therapist cuing, to decrease the risk of other compensations forming.     Time  4    Period  Weeks    Status  New        PT Long Term Goals - 09/24/17 1615      PT LONG TERM GOAL #1   Title  Pt will demo improved Rt ankle plantar flexion strength evident by her ability to complete atleast 20 single leg heel raises, to assist with return to running.     Time  6    Period  Weeks    Status  New    Target Date  11/05/17      PT LONG TERM GOAL #2   Title  Pt will demo anterior reach on the Rt within 4cm of the Lt in order to reflect improved symmetry between LEs for return to jogging.     Time  8    Period  Weeks    Status  New      PT LONG TERM GOAL #3   Title  Pt will maintain BLE single leg stance on a foam  pad/eyes open for atleast 15 sec (2/3 trials) without significant postural sway, in order to reflect an improvement in her ankle proprioception and stability.     Time  8    Period  Weeks    Status  New      PT LONG TERM GOAL #4   Title  Pt will report atleast 75% improvement in ankle pain and stiffness with daily activity from the start of PT, to increase her confidence with walking around her home/work without an ankle brace support.     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     Time  8    Period  Weeks    Status  New            Plan - 09/30/17 1358    Clinical Impression Statement  The patient reports decreasing ankle pain overall with mild lateral ankle swelling at the end of the day.  She is able to participate in a progression of exercises for ROM, strengthening and proprioception with minimal increase in pain intensity.  Difficulty with single leg standing.  Improved dorsiflexion ROM following manual therapy.      Rehab Potential  Good    PT Frequency  2x / week    PT Duration  8 weeks    PT Treatment/Interventions  ADLs/Self Care Home Management;Cryotherapy;Electrical Stimulation;Ultrasound;Moist Heat;Therapeutic activities;Therapeutic exercise;Functional mobility training;Stair training;Gait training;Balance training;Neuromuscular re-education;Orthotic Fit/Training;Patient/family education;Manual techniques;Passive range of motion;Dry needling;Taping    PT Next Visit Plan  standing BAPS ankle mobilizations; progress standing proprioceptive ex;  standing mob with movement       Patient will benefit from skilled therapeutic intervention in order to improve the following deficits and impairments:  Abnormal gait, Decreased activity tolerance, Decreased balance, Difficulty walking,  Increased edema, Impaired flexibility, Hypomobility, Decreased strength, Decreased endurance, Decreased range of motion, Decreased mobility, Pain, Improper body mechanics  Visit Diagnosis: Pain in right ankle and joints of right foot  Stiffness of right ankle, not elsewhere classified  Unsteadiness on feet  Localized edema     Problem List Patient Active Problem List   Diagnosis Date Noted  . Right ankle injury, subsequent encounter 09/04/2017  . Plantar fasciitis of right foot 10/03/2016  . History of endometrial cancer 09/17/2016  . Left knee injury 06/27/2015  . Anxiety 09/21/2014  . Right knee pain 11/30/2013  . ABDOMINAL BLOATING 10/12/2010  . ABDOMINAL PAIN OTHER SPECIFIED SITE 09/28/2010  . NAUSEA ALONE 07/04/2010   Ruben Im, PT 09/30/17 2:03 PM Phone: [phone] Fax: [phone]  Alvera Singh 09/30/2017, 2:02 PM  [facility] [address], [place], [place], [address] Phone: [phone]   Fax:  [phone]  Name: Joanna Barker MRN: [id number] Date of Birth: 1970-11-11

## 2017-10-02 ENCOUNTER — Encounter: Payer: 59 | Admitting: Physical Therapy

## 2017-10-03 ENCOUNTER — Ambulatory Visit: Payer: 59 | Admitting: Physical Therapy

## 2017-10-03 ENCOUNTER — Encounter: Payer: Self-pay | Admitting: Physical Therapy

## 2017-10-03 DIAGNOSIS — M25571 Pain in right ankle and joints of right foot: Secondary | ICD-10-CM

## 2017-10-03 DIAGNOSIS — R600 Localized edema: Secondary | ICD-10-CM

## 2017-10-03 DIAGNOSIS — M25671 Stiffness of right ankle, not elsewhere classified: Secondary | ICD-10-CM

## 2017-10-03 DIAGNOSIS — R2681 Unsteadiness on feet: Secondary | ICD-10-CM

## 2017-10-03 NOTE — Therapy (Signed)
[facility] [address] [place], [place], [address] Phone: [phone]   Fax:  [phone]  Physical Therapy Treatment  Patient Details  Name: Joanna Barker MRN: [id number] Date of Birth: 02/24/1971 Referring Provider: Karlton Lemon, MD    Encounter Date: 10/03/2017  PT End of Session - 10/03/17 1143    Visit Number  3    Number of Visits  17    Date for PT Re-Evaluation  11/19/17    Authorization Time Period  09/24/17 to 11/19/17    PT Start Time  1100    PT Stop Time  1145    PT Time Calculation (min)  45 min    Activity Tolerance  Patient tolerated treatment well    Behavior During Therapy  [facility] for tasks assessed/performed       Past Medical History:  Diagnosis Date  . Cancer ([place])   . Endometrial cancer ([place]) 2010    Past Surgical History:  Procedure Laterality Date  . ABDOMINAL HYSTERECTOMY    . BREAST LUMPECTOMY Right 2007  . BREAST SURGERY     lumpectomy   . HYSTEROSCOPY    . MYOMECTOMY  2008  . OOPHORECTOMY Left 2010  . PELVIC LAPAROSCOPY      There were no vitals filed for this visit.  Subjective Assessment - 10/03/17 1106    Subjective  My ankle has more movements.  Patient reports her pain is 85% better. The aching is intermittent now.     Limitations  Walking;Other (comment)    How long can you sit comfortably?  unlimited     How long can you stand comfortably?  unlimited    How long can you walk comfortably?  up to 1 mile walking her dog     Diagnostic tests  musculoskeletal ultrasound negative; X-ray negative    Patient Stated Goals  run her half marathon in January 18th    Currently in Pain?  Yes    Pain Score  1     Pain Location  Ankle    Pain Orientation  Right    Pain Descriptors / Indicators  Dull;Aching    Pain Type  Acute pain    Pain Onset  1 to 4 weeks ago    Pain Frequency  Intermittent    Aggravating Factors   walking; descending stairs    Pain Relieving Factors   ice, rest, brace    Multiple Pain Sites  No         [facility] PT Assessment - 10/03/17 0001      ROM / Strength   AROM / PROM / Strength  PROM      PROM   Right Ankle Dorsiflexion  8                  [facility] Adult PT Treatment/Exercise - 10/03/17 0001      Modalities   Modalities  Cryotherapy      Cryotherapy   Number Minutes Cryotherapy  15 Minutes    Cryotherapy Location  Ankle    Type of Cryotherapy  Ice pack      Manual Therapy   Manual Therapy  Joint mobilization    Manual therapy comments  standing mob with movement with belt 2x 15    Joint Mobilization  grade 1&2 5x 10 sec each: talocrural; dorsiflexion, inversion,eversion; prone dorsiflexion 10x      Ankle Exercises: Standing   BAPS  Standing;Level 2;10 reps;Limitations left foot on floor; all directions  Other Standing Ankle Exercises  WB on right 4 ways 2x 10 using yellow band on flat floor      Ankle Exercises: Aerobic   Stationary Bike  6 min L 3      Ankle Exercises: Stretches   Slant Board Stretch  3 reps;20 seconds               PT Short Term Goals - 09/24/17 1613      PT SHORT TERM GOAL #1   Title  Pt will demo consistency and independence with her HEP to decrease ankle pain and swelling.     Time  4    Period  Weeks    Status  New    Target Date  10/22/17      PT SHORT TERM GOAL #2   Title  Pt will demo improved Rt ankle active dorsiflexion ROM to atleast 10 deg, which will assist with foot clearance during ambulation.     Time  4    Period  Weeks    Status  New      PT SHORT TERM GOAL #3   Title  Pt will ambulate throughout her session with proper heel strike, step length and push off on the Rt, without therapist cuing, to decrease the risk of other compensations forming.     Time  4    Period  Weeks    Status  New        PT Long Term Goals - 09/24/17 1615      PT LONG TERM GOAL #1   Title  Pt will demo improved Rt ankle plantar flexion strength evident by her  ability to complete atleast 20 single leg heel raises, to assist with return to running.     Time  6    Period  Weeks    Status  New    Target Date  11/05/17      PT LONG TERM GOAL #2   Title  Pt will demo anterior reach on the Rt within 4cm of the Lt in order to reflect improved symmetry between LEs for return to jogging.     Time  8    Period  Weeks    Status  New      PT LONG TERM GOAL #3   Title  Pt will maintain BLE single leg stance on a foam pad/eyes open for atleast 15 sec (2/3 trials) without significant postural sway, in order to reflect an improvement in her ankle proprioception and stability.     Time  8    Period  Weeks    Status  New      PT LONG TERM GOAL #4   Title  Pt will report atleast 75% improvement in ankle pain and stiffness with daily activity from the start of PT, to increase her confidence with walking around her home/work without an ankle brace support.     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     Time  8    Period  Weeks    Status  New            Plan - 10/03/17 1143    Clinical Impression  Statement  Patient reports her pain is 85% better.  Her dorsiflexion has increased to 8 degrees for PROM.  Patient has improve movement of right ankle.  She has tightness in right arch and lateral malleolus.  Patient will benefit from skilled therapy to improve right ankle ROM and strength.     Rehab Potential  Good    PT Frequency  2x / week    PT Duration  8 weeks    PT Treatment/Interventions  ADLs/Self Care Home Management;Cryotherapy;Electrical Stimulation;Ultrasound;Moist Heat;Therapeutic activities;Therapeutic exercise;Functional mobility training;Stair training;Gait training;Balance training;Neuromuscular re-education;Orthotic Fit/Training;Patient/family  education;Manual techniques;Passive range of motion;Dry needling;Taping    PT Next Visit Plan  standing BAPS ankle mobilizations; progress standing proprioceptive ex; standing mob with movement    PT Home Exercise Plan  progress as needed    Recommended Other Services  MD signed initial eval    Consulted and Agree with Plan of Care  Patient       Patient will benefit from skilled therapeutic intervention in order to improve the following deficits and impairments:  Abnormal gait, Decreased activity tolerance, Decreased balance, Difficulty walking, Increased edema, Impaired flexibility, Hypomobility, Decreased strength, Decreased endurance, Decreased range of motion, Decreased mobility, Pain, Improper body mechanics  Visit Diagnosis: Pain in right ankle and joints of right foot  Stiffness of right ankle, not elsewhere classified  Unsteadiness on feet  Localized edema     Problem List Patient Active Problem List   Diagnosis Date Noted  . Right ankle injury, subsequent encounter 09/04/2017  . Plantar fasciitis of right foot 10/03/2016  . History of endometrial cancer 09/17/2016  . Left knee injury 06/27/2015  . Anxiety 09/21/2014  . Right knee pain 11/30/2013  . ABDOMINAL BLOATING 10/12/2010  . ABDOMINAL PAIN OTHER SPECIFIED SITE 09/28/2010  . NAUSEA ALONE 07/04/2010    Earlie Counts, PT 10/03/17 11:46 AM   [facility] [address], [place], [place], [address] Phone: [phone]   Fax:  [phone]  Name: Joanna Barker MRN: [id number] Date of Birth: March 29, 1971

## 2017-10-07 ENCOUNTER — Other Ambulatory Visit: Payer: Self-pay

## 2017-10-07 ENCOUNTER — Other Ambulatory Visit: Payer: 59

## 2017-10-07 ENCOUNTER — Encounter: Payer: Self-pay | Admitting: Obstetrics and Gynecology

## 2017-10-07 ENCOUNTER — Encounter: Payer: 59 | Admitting: Physical Therapy

## 2017-10-07 ENCOUNTER — Ambulatory Visit (INDEPENDENT_AMBULATORY_CARE_PROVIDER_SITE_OTHER): Payer: 59 | Admitting: Obstetrics and Gynecology

## 2017-10-07 ENCOUNTER — Ambulatory Visit (INDEPENDENT_AMBULATORY_CARE_PROVIDER_SITE_OTHER): Payer: 59

## 2017-10-07 VITALS — BP 128/80 | HR 64 | Resp 14 | Wt 186.0 lb

## 2017-10-07 DIAGNOSIS — R102 Pelvic and perineal pain: Secondary | ICD-10-CM | POA: Diagnosis not present

## 2017-10-07 DIAGNOSIS — R1900 Intra-abdominal and pelvic swelling, mass and lump, unspecified site: Secondary | ICD-10-CM

## 2017-10-07 DIAGNOSIS — Z8542 Personal history of malignant neoplasm of other parts of uterus: Secondary | ICD-10-CM

## 2017-10-07 DIAGNOSIS — R109 Unspecified abdominal pain: Secondary | ICD-10-CM | POA: Diagnosis not present

## 2017-10-07 NOTE — Progress Notes (Signed)
GYNECOLOGY  VISIT   HPI: 46 y.o.   Married  Caucasian  female   [id number] with No LMP recorded. Patient has had a hysterectomy.   here for follow up RLQ pain. She has a h/o Stage 1-A, Grade 1 adenocarcinoma of the endometrium and is s/o TLH/LSO in 5/10 (pathology is in the chart).     GYNECOLOGIC HISTORY: No LMP recorded. Patient has had a hysterectomy. Contraception:hysterectomy  Menopausal hormone therapy: none         OB History    Gravida Para Term Preterm AB Living   0 0 0 0 0 0   SAB TAB Ectopic Multiple Live Births   0 0 0 0 0         Patient Active Problem List   Diagnosis Date Noted  . Right ankle injury, subsequent encounter 09/04/2017  . Plantar fasciitis of right foot 10/03/2016  . History of endometrial cancer 09/17/2016  . Left knee injury 06/27/2015  . Anxiety 09/21/2014  . Right knee pain 11/30/2013  . ABDOMINAL BLOATING 10/12/2010  . ABDOMINAL PAIN OTHER SPECIFIED SITE 09/28/2010  . NAUSEA ALONE 07/04/2010    Past Medical History:  Diagnosis Date  . Cancer ([place])   . Endometrial cancer ([place]) 2010    Past Surgical History:  Procedure Laterality Date  . ABDOMINAL HYSTERECTOMY    . BREAST LUMPECTOMY Right 2007  . BREAST SURGERY     lumpectomy   . HYSTEROSCOPY    . MYOMECTOMY  2008  . OOPHORECTOMY Left 2010  . PELVIC LAPAROSCOPY      No current outpatient medications on file.   No current facility-administered medications for this visit.      ALLERGIES: Patient has no known allergies.  Family History  Problem Relation Age of Onset  . Hypertension Mother   . Hypertension Father   . Rheum arthritis Father   . Diabetes Neg Hx   . Heart attack Neg Hx   . Hyperlipidemia Neg Hx   . Sudden death Neg Hx     Social History   Socioeconomic History  . Marital status: Married    Spouse name: Not on file  . Number of children: Not on file  . Years of education: Not on file  . Highest education level: Not on file  Social Needs  . Financial  resource strain: Not on file  . Food insecurity - worry: Not on file  . Food insecurity - inability: Not on file  . Transportation needs - medical: Not on file  . Transportation needs - non-medical: Not on file  Occupational History  . Not on file  Tobacco Use  . Smoking status: Never Smoker  . Smokeless tobacco: Never Used  Substance and Sexual Activity  . Alcohol use: Yes    Alcohol/week: 1.8 - 2.4 oz    Types: 3 - 4 Standard drinks or equivalent per week  . Drug use: No  . Sexual activity: Yes    Partners: Male    Birth control/protection: Surgical    Comment: hysterectomy   Other Topics Concern  . Not on file  Social History Narrative  . Not on file    Review of Systems  Constitutional: Negative.   HENT: Negative.   Eyes: Negative.   Respiratory: Negative.   Cardiovascular: Negative.   Gastrointestinal: Negative.   Genitourinary:       RLQ pain   Musculoskeletal: Negative.   Skin: Negative.   Neurological: Negative.   Endo/Heme/Allergies: Negative.   Psychiatric/Behavioral: Negative.  PHYSICAL EXAMINATION:    BP 128/80 (BP Location: Right Arm, Patient Position: Sitting, Cuff Size: Normal)   Pulse 64   Resp 14   Wt 186 lb (84.4 kg)   BMI 29.13 kg/m     General appearance: alert, cooperative and appears stated age  Ultrasound images reviewed with the patient. She has a normal right adnexa and a solid mass in the left adnexa (looks like the mass is w/in an ovary). The mass is 19 x 16 mm with vascular flow.   ASSESSMENT Pelvic mass with vascular flow in the left adnexal region in a patient with a h/o TLH/LSO for endometrial cancer    PLAN Will refer to Dr Denman George in [place] I will reach out to Dr Denman George to see if she wants any further imaging prior to her appointment.   An After Visit Summary was printed and given to the patient.

## 2017-10-07 NOTE — Progress Notes (Signed)
Scheduled patient while in office for appointment with Dr.Rossi at [facility] on 10/17/2017 at  9 am. Patient is agreeable to date and time.

## 2017-10-08 ENCOUNTER — Ambulatory Visit: Payer: 59 | Admitting: Physical Therapy

## 2017-10-08 ENCOUNTER — Encounter: Payer: Self-pay | Admitting: Physical Therapy

## 2017-10-08 ENCOUNTER — Encounter: Payer: Self-pay | Admitting: Obstetrics and Gynecology

## 2017-10-08 DIAGNOSIS — M25571 Pain in right ankle and joints of right foot: Secondary | ICD-10-CM | POA: Diagnosis not present

## 2017-10-08 DIAGNOSIS — M25671 Stiffness of right ankle, not elsewhere classified: Secondary | ICD-10-CM

## 2017-10-08 DIAGNOSIS — R2681 Unsteadiness on feet: Secondary | ICD-10-CM

## 2017-10-08 DIAGNOSIS — R600 Localized edema: Secondary | ICD-10-CM

## 2017-10-08 NOTE — Therapy (Signed)
[facility] [address], [place], [place], [address] Phone: [phone]   Fax:  [phone]  Physical Therapy Treatment  Patient Details  Name: Joanna Barker MRN: [id number] Date of Birth: 1971-10-21 Referring Provider: Karlton Lemon, MD    Encounter Date: 10/08/2017  PT End of Session - 10/08/17 1653    Visit Number  4    Number of Visits  17    Date for PT Re-Evaluation  11/19/17    Authorization Type  [employer]    Authorization Time Period  09/24/17 to 11/19/17    PT Start Time  1610    PT Stop Time  1650    PT Time Calculation (min)  40 min    Activity Tolerance  Patient tolerated treatment well    Behavior During Therapy  [facility] for tasks assessed/performed       Past Medical History:  Diagnosis Date  . Cancer ([place])   . Endometrial cancer ([employer]) 2010    Past Surgical History:  Procedure Laterality Date  . ABDOMINAL HYSTERECTOMY    . BREAST LUMPECTOMY Right 2007  . BREAST SURGERY     lumpectomy   . HYSTEROSCOPY    . MYOMECTOMY  2008  . OOPHORECTOMY Left 2010  . PELVIC LAPAROSCOPY      There were no vitals filed for this visit.  Subjective Assessment - 10/08/17 1614    Subjective  I was a little sore from last visit.     Pertinent History  n/a     Limitations  Walking;Other (comment)    How long can you sit comfortably?  unlimited     How long can you stand comfortably?  unlimited    How long can you walk comfortably?  up to 1 mile walking her dog     Diagnostic tests  musculoskeletal ultrasound negative; X-ray negative    Patient Stated Goals  run her half marathon in January 18th    Currently in Pain?  Yes    Pain Score  1     Pain Location  Ankle    Pain Orientation  Right    Pain Descriptors / Indicators  Aching;Dull    Pain Type  Acute pain    Pain Onset  1 to 4 weeks ago    Aggravating Factors   walking, descending stairs    Pain Relieving Factors  ice, rest, brace    Multiple Pain Sites  No         [facility] PT Assessment - 10/08/17 0001      Precautions   Precautions  Other (comment)    Precaution Comments  endometrial cancer                  [facility] Adult PT Treatment/Exercise - 10/08/17 0001      Knee/Hip Exercises: Standing   Lateral Step Up  1 set;20 reps;Hand Hold: 0;Step Height: 2"    Step Down  20 reps;2 sets;Right therapist guiding the talus and calcaneus    Other Standing Knee Exercises  stand on left and slide left foot side, forward, back 10x each      Manual Therapy   Manual Therapy  Joint mobilization;Soft tissue mobilization    Manual therapy comments  standing mob with movement with belt 2x 15    Joint Mobilization  grade 1&2 5x 10 sec each: talocrural; dorsiflexion, inversion,eversion; prone dorsiflexion 10x    Soft tissue mobilization  right foot, arch of foot, and medial calcaneus  Ankle Exercises: Aerobic   Stationary Bike  6 min L 3      Ankle Exercises: Standing   Rebounder  bounce, 3 ways 1 min each    Heel Raises  10 reps up 2 legs quick, down right slow    Heel Walk (Round Trip)  40 feet    Toe Walk (Round Trip)  4o feet    Side Shuffle (Round Trip)  shuffle side to side 30 sec thent forward back 30 sec    Other Standing Ankle Exercises  WB on right 4 ways 2x 10 using yellow band on flat floor               PT Short Term Goals - 10/08/17 1655      PT SHORT TERM GOAL #1   Title  Pt will demo consistency and independence with her HEP to decrease ankle pain and swelling.     Time  4    Period  Weeks    Status  Achieved      PT SHORT TERM GOAL #2   Title  Pt will demo improved Rt ankle active dorsiflexion ROM to atleast 10 deg, which will assist with foot clearance during ambulation.     Time  4    Period  Weeks    Status  On-going      PT SHORT TERM GOAL #3   Title  Pt will ambulate throughout her session with proper heel strike, step length and push off on the Rt, without therapist  cuing, to decrease the risk of other compensations forming.     Time  4    Period  Weeks    Status  On-going        PT Long Term Goals - 09/24/17 1615      PT LONG TERM GOAL #1   Title  Pt will demo improved Rt ankle plantar flexion strength evident by her ability to complete atleast 20 single leg heel raises, to assist with return to running.     Time  6    Period  Weeks    Status  New    Target Date  11/05/17      PT LONG TERM GOAL #2   Title  Pt will demo anterior reach on the Rt within 4cm of the Lt in order to reflect improved symmetry between LEs for return to jogging.     Time  8    Period  Weeks    Status  New      PT LONG TERM GOAL #3   Title  Pt will maintain BLE single leg stance on a foam pad/eyes open for atleast 15 sec (2/3 trials) without significant postural sway, in order to reflect an improvement in her ankle proprioception and stability.     Time  8    Period  Weeks    Status  New      PT LONG TERM GOAL #4   Title  Pt will report atleast 75% improvement in ankle pain and stiffness with daily activity from the start of PT, to increase her confidence with walking around her home/work without an ankle brace support.     Time  8    Period  Weeks    Status  New      PT LONG TERM GOAL #5   Title  Pt will return to jogging atleast 50% of her prior distance with no more than 3/10 discomfort or pain, in order to help  facilitate her return to trianing for races.     Time  8    Period  Weeks    Status  New            Plan - 10/08/17 1653    Clinical Impression Statement  At end of therapy, patient had no pain in medial calcaneus with dorsiflexion.  Patient has difficulty with keeping her right knee straight with knee flexion, she will bring it inward.  Patient has difficulty with stepping downward.  Patient has increased fascial tightness in right arch, decreased mobility of talus and cuboids.  Patient will benefit from skilled therapy to improve right ankle  ROM and strength.     Rehab Potential  Good    PT Frequency  2x / week    PT Duration  8 weeks    PT Treatment/Interventions  ADLs/Self Care Home Management;Cryotherapy;Electrical Stimulation;Ultrasound;Moist Heat;Therapeutic activities;Therapeutic exercise;Functional mobility training;Stair training;Gait training;Balance training;Neuromuscular re-education;Orthotic Fit/Training;Patient/family education;Manual techniques;Passive range of motion;Dry needling;Taping    PT Next Visit Plan  standing BAPS ankle mobilizations; progress standing proprioceptive ex; standing mob with movement; measure right ankle DF;     PT Home Exercise Plan  progress as needed    Consulted and Agree with Plan of Care  Patient       Patient will benefit from skilled therapeutic intervention in order to improve the following deficits and impairments:  Abnormal gait, Decreased activity tolerance, Decreased balance, Difficulty walking, Increased edema, Impaired flexibility, Hypomobility, Decreased strength, Decreased endurance, Decreased range of motion, Decreased mobility, Pain, Improper body mechanics  Visit Diagnosis: Pain in right ankle and joints of right foot  Stiffness of right ankle, not elsewhere classified  Unsteadiness on feet  Localized edema     Problem List Patient Active Problem List   Diagnosis Date Noted  . Right ankle injury, subsequent encounter 09/04/2017  . Plantar fasciitis of right foot 10/03/2016  . History of endometrial cancer 09/17/2016  . Left knee injury 06/27/2015  . Anxiety 09/21/2014  . Right knee pain 11/30/2013  . ABDOMINAL BLOATING 10/12/2010  . ABDOMINAL PAIN OTHER SPECIFIED SITE 09/28/2010  . NAUSEA ALONE 07/04/2010    Earlie Counts, PT 10/08/17 4:57 PM   [facility] [address] [place], [place], [address] Phone: [phone]   Fax:  [phone]  Name: David Towson MRN: [id number] Date of Birth:  [birth year]

## 2017-10-09 ENCOUNTER — Encounter: Payer: 59 | Admitting: Physical Therapy

## 2017-10-09 ENCOUNTER — Telehealth: Payer: Self-pay

## 2017-10-09 DIAGNOSIS — R1900 Intra-abdominal and pelvic swelling, mass and lump, unspecified site: Secondary | ICD-10-CM

## 2017-10-09 DIAGNOSIS — N949 Unspecified condition associated with female genital organs and menstrual cycle: Secondary | ICD-10-CM

## 2017-10-09 NOTE — Telephone Encounter (Signed)
Spoke with patient. Patient is agreeable to have pelvic MRI completed. Advised will speak with [facility] and return call with appointment date and time. Patient is agreeable.  Call to [place]. Spoke with [employer]. MRI pelvis w wo contrast scheduled for 10/13/2017 at 9 am with 8:50 am arrival at [facility].  Spoke with patient who is agreeable to date and time of appointment. Placed in imaging hold.  Routing to provider for final review. Patient agreeable to disposition. Will close encounter.

## 2017-10-09 NOTE — Telephone Encounter (Signed)
-----   Message from Salvadore Dom, MD sent at 10/09/2017  1:58 PM EST ----- See below note. Please try and get her in for a MRI prior to her appointment on the 28th if possible. Thanks, Sharee Pimple ----- Message ----- From: Everitt Amber, MD Sent: 10/08/2017   3:44 PM To: Salvadore Dom, MD  [facility], An MRI might be helpful to look at other pelvic structures to see where this might be coming from. But no worries if you can't get it ordered before the 28th because we can order it after seeing her if it's not done. Reinaldo Meeker  ----- Message ----- From: Salvadore Dom, MD Sent: 10/08/2017  12:18 PM To: Everitt Amber, MD  [facility], This patient has a h/o a TLH/LSO for endometrial cancer in 2010. She had an ultrasound yesterday for pain and was noted to have a pelvic mass on the left (looked like an ovarian mass, but that ovary was definitely taken out). She has an appointment to see you on 10/17/17. Do you want me to order any additional imaging prior to her visit with you? Thanks and happy holidays! Sharee Pimple  PS: her path report is in care everywhere.

## 2017-10-10 ENCOUNTER — Encounter: Payer: Self-pay | Admitting: Physical Therapy

## 2017-10-10 ENCOUNTER — Ambulatory Visit: Payer: 59 | Admitting: Physical Therapy

## 2017-10-10 DIAGNOSIS — R2681 Unsteadiness on feet: Secondary | ICD-10-CM

## 2017-10-10 DIAGNOSIS — M25571 Pain in right ankle and joints of right foot: Secondary | ICD-10-CM

## 2017-10-10 DIAGNOSIS — R600 Localized edema: Secondary | ICD-10-CM

## 2017-10-10 DIAGNOSIS — M25671 Stiffness of right ankle, not elsewhere classified: Secondary | ICD-10-CM

## 2017-10-10 NOTE — Therapy (Signed)
[facility] [address], [place], [place], [address] Phone: [phone]   Fax:  [phone]  Physical Therapy Treatment  Patient Details  Name: Joanna Barker MRN: [id number] Date of Birth: [birth year] Referring Provider: Karlton Lemon, MD    Encounter Date: 10/10/2017  PT End of Session - 10/10/17 1006    Visit Number  5    Number of Visits  17    Date for PT Re-Evaluation  11/19/17    Authorization Type  [employer] healthcare    Authorization Time Period  09/24/17 to 11/19/17    PT Start Time  0845    PT Stop Time  0925    PT Time Calculation (min)  40 min    Activity Tolerance  Patient tolerated treatment well       Past Medical History:  Diagnosis Date  . Cancer ([place])   . Endometrial cancer ([place]) 2010    Past Surgical History:  Procedure Laterality Date  . ABDOMINAL HYSTERECTOMY    . BREAST LUMPECTOMY Right 2007  . BREAST SURGERY     lumpectomy   . HYSTEROSCOPY    . MYOMECTOMY  2008  . OOPHORECTOMY Left 2010  . PELVIC LAPAROSCOPY      There were no vitals filed for this visit.  Subjective Assessment - 10/10/17 0849    Subjective  I'm feeling really good.  I was encouraged with what I did here last time.  Used the gym Elliptical with greater ease.  Walked 2 1/2 miles walking the dog.  I'm nervous about the race.  Stairs better but modifying a little with descending with dorsiflexion.      Pertinent History  1/2 marathon in mid January    Patient Stated Goals  run her half marathon in January 18th    Currently in Pain?  No/denies    Pain Score  0-No pain                      [facility] Adult PT Treatment/Exercise - 10/10/17 0001      Knee/Hip Exercises: Standing   Step Down  Left;15 reps;Step Height: 4" therapist using hand and belt mob with movement      Manual Therapy   Manual therapy comments  standing mob with movement with belt 2x 15      Ankle Exercises: Standing   Rocker Board   1 minute    Rebounder  bounce, 3 ways 1 min each    Heel Walk (Round Trip)  high step walk    Side Shuffle (Round Trip)  shuffle side to side 30 sec thent forward back 30 sec    Other Standing Ankle Exercises  standing on black foam with right leg 4 ways 2x10    Other Standing Ankle Exercises  SLS with red plyo ball toss on rebounder forward throws and lateral throws 40x      Ankle Exercises: Stretches   Plantar Fascia Stretch  2 reps wall    Gastroc Stretch  2 reps;20 seconds      Ankle Exercises: Aerobic   Stationary Bike  6 min L 3      Ankle Exercises: Plyometrics   Plyometric Exercises  lateral push off from BOSU right and left 15x each               PT Short Term Goals - 10/08/17 1655      PT SHORT TERM GOAL #1   Title  Pt will demo  consistency and independence with her HEP to decrease ankle pain and swelling.     Time  4    Period  Weeks    Status  Achieved      PT SHORT TERM GOAL #2   Title  Pt will demo improved Rt ankle active dorsiflexion ROM to atleast 10 deg, which will assist with foot clearance during ambulation.     Time  4    Period  Weeks    Status  On-going      PT SHORT TERM GOAL #3   Title  Pt will ambulate throughout her session with proper heel strike, step length and push off on the Rt, without therapist cuing, to decrease the risk of other compensations forming.     Time  4    Period  Weeks    Status  On-going        PT Long Term Goals - 09/24/17 1615      PT LONG TERM GOAL #1   Title  Pt will demo improved Rt ankle plantar flexion strength evident by her ability to complete atleast 20 single leg heel raises, to assist with return to running.     Time  6    Period  Weeks    Status  New    Target Date  11/05/17      PT LONG TERM GOAL #2   Title  Pt will demo anterior reach on the Rt within 4cm of the Lt in order to reflect improved symmetry between LEs for return to jogging.     Time  8    Period  Weeks    Status  New      PT  LONG TERM GOAL #3   Title  Pt will maintain BLE single leg stance on a foam pad/eyes open for atleast 15 sec (2/3 trials) without significant postural sway, in order to reflect an improvement in her ankle proprioception and stability.     Time  8    Period  Weeks    Status  New      PT LONG TERM GOAL #4   Title  Pt will report atleast 75% improvement in ankle pain and stiffness with daily activity from the start of PT, to increase her confidence with walking around her home/work without an ankle brace support.     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     Time  8    Period  Weeks    Status  New            Plan - 10/10/17 1007    Clinical Impression Statement  The patient is progressing well s/p right knee ankle sprain.  She is feeling more optimistic in her 1/2 marathon in mid January even if she has to do a run/walk interval.  She is able to participate in a progression of agility, proprioception and a low level plyometric.  If she continues to do well over the weekend and Christmas holiday, will try walking/jogging on the treadmill next visit.  She declines the need for modalities today.      PT Frequency  2x / week    PT Duration  8 weeks    PT Treatment/Interventions  ADLs/Self Care Home Management;Cryotherapy;Electrical Stimulation;Ultrasound;Moist Heat;Therapeutic activities;Therapeutic exercise;Functional mobility training;Stair training;Gait training;Balance training;Neuromuscular re-education;Orthotic Fit/Training;Patient/family education;Manual techniques;Passive range of motion;Dry needling;Taping    PT Next Visit Plan  standing BAPS ankle mobilizations; progress standing proprioceptive ex; standing mob with movement; measure right ankle DF; try jogging on the treadmill.          Patient will benefit from skilled therapeutic intervention in order to improve the following deficits and impairments:  Abnormal gait, Decreased activity tolerance, Decreased balance, Difficulty walking, Increased edema, Impaired flexibility, Hypomobility, Decreased strength, Decreased endurance, Decreased range of motion, Decreased mobility, Pain, Improper body mechanics  Visit Diagnosis: Pain in right ankle and joints of right foot  Stiffness of right ankle, not elsewhere classified  Unsteadiness on feet  Localized edema     Problem List Patient Active Problem List   Diagnosis Date Noted  . Right ankle injury, subsequent encounter 09/04/2017  . Plantar fasciitis of right foot 10/03/2016  . History of endometrial cancer 09/17/2016  . Left knee injury 06/27/2015  . Anxiety 09/21/2014  . Right knee pain 11/30/2013  . ABDOMINAL BLOATING 10/12/2010  . ABDOMINAL PAIN OTHER SPECIFIED SITE 09/28/2010  . NAUSEA ALONE 07/04/2010   Ruben Im, PT 10/10/17 10:58 AM Phone: [phone] Fax: [phone]  Alvera Singh 10/10/2017, 10:58 AM  [facility] [address], [place], [place], [address] Phone: [phone]   Fax:  [phone]  Name: Joanna Barker MRN: [id number] Date of Birth: December 19, 1970

## 2017-10-13 ENCOUNTER — Ambulatory Visit
Admission: RE | Admit: 2017-10-13 | Discharge: 2017-10-13 | Disposition: A | Discharge status: Home / Self Care | Payer: 59 | Source: Ambulatory Visit | Attending: Obstetrics and Gynecology | Admitting: Obstetrics and Gynecology

## 2017-10-13 DIAGNOSIS — R1900 Intra-abdominal and pelvic swelling, mass and lump, unspecified site: Secondary | ICD-10-CM

## 2017-10-13 MED ORDER — GADOBENATE DIMEGLUMINE 529 MG/ML IV SOLN
17.0000 mL | Freq: Once | INTRAVENOUS | Status: AC | PRN
  Administered 2017-10-13: 17 mL via INTRAVENOUS

## 2017-10-15 ENCOUNTER — Telehealth: Payer: Self-pay

## 2017-10-15 ENCOUNTER — Ambulatory Visit: Payer: 59 | Admitting: Physical Therapy

## 2017-10-15 ENCOUNTER — Other Ambulatory Visit: Payer: Self-pay | Admitting: Obstetrics and Gynecology

## 2017-10-15 DIAGNOSIS — M25671 Stiffness of right ankle, not elsewhere classified: Secondary | ICD-10-CM

## 2017-10-15 DIAGNOSIS — M25571 Pain in right ankle and joints of right foot: Secondary | ICD-10-CM

## 2017-10-15 DIAGNOSIS — R1900 Intra-abdominal and pelvic swelling, mass and lump, unspecified site: Secondary | ICD-10-CM

## 2017-10-15 DIAGNOSIS — R2681 Unsteadiness on feet: Secondary | ICD-10-CM

## 2017-10-15 NOTE — Telephone Encounter (Signed)
Order faxed to Vivien Rota at hospital scheduling for review with radiologist. Vivien Rota will contact the patient with scheduling.

## 2017-10-15 NOTE — Telephone Encounter (Signed)
Spoke with [facility] who states they do not perform percutaneous transgluteal biopsies and this will likely need to be done at the hospital. Spoke with Vivien Rota at hospital scheduling who needs an order faxed to her attention to [phone] to review with radiologist to determine scheduling.

## 2017-10-15 NOTE — Therapy (Signed)
[facility] [address] [place], [place], [address] Phone: [phone]   Fax:  [phone]  Physical Therapy Treatment  Patient Details  Name: Joanna Barker MRN: [id number] Date of Birth: 09/29/1971 Referring Provider: Dr. Barbaraann Barthel    Encounter Date: 10/15/2017  PT End of Session - 10/15/17 1151    Visit Number  6    Number of Visits  17    Date for PT Re-Evaluation  11/19/17    Authorization Type  [employer]    PT Start Time  9622    PT Stop Time  1235 Ice pack last 10 min     PT Time Calculation (min)  46 min    Activity Tolerance  Patient tolerated treatment well    Behavior During Therapy  [facility] for tasks assessed/performed       Past Medical History:  Diagnosis Date  . Cancer ([place])   . Endometrial cancer ([place]) 2010    Past Surgical History:  Procedure Laterality Date  . ABDOMINAL HYSTERECTOMY    . BREAST LUMPECTOMY Right 2007  . BREAST SURGERY     lumpectomy   . HYSTEROSCOPY    . MYOMECTOMY  2008  . OOPHORECTOMY Left 2010  . PELVIC LAPAROSCOPY      There were no vitals filed for this visit.  Subjective Assessment - 10/15/17 1151    Subjective  Pt is anxious to try jogging and wondering how she will do for half marathon in January in [place].  Otherwise no new changes since last visit.     Currently in Pain?  No/denies    Pain Score  0-No pain         [facility] PT Assessment - 10/15/17 0001      Assessment   Medical Diagnosis  Rt ankle injury, subsequent encounter     Referring Provider  Dr. Barbaraann Barthel     Next MD Visit  10/20/17      AROM   Right Ankle Dorsiflexion  9    Right Ankle Inversion  42    Right Ankle Eversion  17      Strength   Right Ankle Dorsiflexion  5/5    Right Ankle Plantar Flexion  5/5 20 single leg heel raises    Right Ankle Inversion  5/5    Right Ankle Eversion  -- 5-/5    Left Ankle Plantar Flexion  -- 20 heel raises         [facility] Adult PT  Treatment/Exercise - 10/15/17 0001      Cryotherapy   Number Minutes Cryotherapy  10 Minutes    Cryotherapy Location  Ankle Rt    Type of Cryotherapy  Ice pack      Ankle Exercises: Aerobic   Elliptical  L1: 4.5 min  PTA present to discuss progress.     Tread Mill  3.1 mph x 2 min brisk walk then 1 min @ 3.2mh, walk @ 3.1 mph; 1.5 min @ 4.2 mph, 30 sec walk, 2 min jog @ 4.521m.  followed by cool down.  total time on TM = 11 min       Ankle Exercises: Seated   Other Seated Ankle Exercises  green band with Rt foot eversion x 2 sets of 10, then 1 set long sitting eversion/ PF      Ankle Exercises: Standing   Other Standing Ankle Exercises  Rt squat with LLE sliding laterally x 10, LLE sliding into extension x 10; 5 reps on  LLE for comparison.      Ankle Exercises: Stretches   Plantar Fascia Stretch  2 reps;30 seconds    Soleus Stretch  3 reps;30 seconds    Gastroc Stretch  3 reps;20 seconds             PT Education - 10/15/17 1220    Education provided  Yes    Education Details  green band eversion in sitting; walk run progression.     Person(s) Educated  Patient    Methods  Explanation;Demonstration    Comprehension  Verbalized understanding;Returned demonstration       PT Short Term Goals - 10/08/17 1655      PT SHORT TERM GOAL #1   Title  Pt will demo consistency and independence with her HEP to decrease ankle pain and swelling.     Time  4    Period  Weeks    Status  Achieved      PT SHORT TERM GOAL #2   Title  Pt will demo improved Rt ankle active dorsiflexion ROM to atleast 10 deg, which will assist with foot clearance during ambulation.     Time  4    Period  Weeks    Status  On-going      PT SHORT TERM GOAL #3   Title  Pt will ambulate throughout her session with proper heel strike, step length and push off on the Rt, without therapist cuing, to decrease the risk of other compensations forming.     Time  4    Period  Weeks    Status  On-going         PT Long Term Goals - 10/15/17 1714      PT LONG TERM GOAL #1   Title  Pt will demo improved Rt ankle plantar flexion strength evident by her ability to complete atleast 20 single leg heel raises, to assist with return to running.     Time  6    Period  Weeks    Status  Achieved      PT LONG TERM GOAL #2   Title  Pt will demo anterior reach on the Rt within 4cm of the Lt in order to reflect improved symmetry between LEs for return to jogging.     Time  8    Period  Weeks    Status  On-going      PT LONG TERM GOAL #3   Title  Pt will maintain BLE single leg stance on a foam pad/eyes open for atleast 15 sec (2/3 trials) without significant postural sway, in order to reflect an improvement in her ankle proprioception and stability.     Time  8    Status  On-going      PT LONG TERM GOAL #4   Title  Pt will report atleast 75% improvement in ankle pain and stiffness with daily activity from the start of PT, to increase her confidence with walking around her home/work without an ankle brace support.     Time  8    Period  Weeks    Status  On-going      PT LONG TERM GOAL #5   Title  Pt will return to jogging atleast 50% of her prior distance with no more than 3/10 discomfort or pain, in order to help facilitate her return to trianing for races.     Time  8    Period  Weeks    Status  On-going  Plan - 10/15/17 1221    Clinical Impression Statement  Pt able to complete 11 min of walk/jog progression up to 4.5 mph, without any symptoms.  Her Rt ankle ROM and strength have improved.  Pt has met LTG #1 and making good gains towards remaining goals.     Rehab Potential  Good    PT Frequency  2x / week    PT Duration  8 weeks    PT Treatment/Interventions  ADLs/Self Care Home Management;Cryotherapy;Electrical Stimulation;Ultrasound;Moist Heat;Therapeutic activities;Therapeutic exercise;Functional mobility training;Stair training;Gait training;Balance  training;Neuromuscular re-education;Orthotic Fit/Training;Patient/family education;Manual techniques;Passive range of motion;Dry needling;Taping    PT Next Visit Plan  continue progressive LE strengthening, proprioception, flexibility.        Patient will benefit from skilled therapeutic intervention in order to improve the following deficits and impairments:  Abnormal gait, Decreased activity tolerance, Decreased balance, Difficulty walking, Increased edema, Impaired flexibility, Hypomobility, Decreased strength, Decreased endurance, Decreased range of motion, Decreased mobility, Pain, Improper body mechanics  Visit Diagnosis: Pain in right ankle and joints of right foot  Stiffness of right ankle, not elsewhere classified  Unsteadiness on feet     Problem List Patient Active Problem List   Diagnosis Date Noted  . Right ankle injury, subsequent encounter 09/04/2017  . Plantar fasciitis of right foot 10/03/2016  . History of endometrial cancer 09/17/2016  . Left knee injury 06/27/2015  . Anxiety 09/21/2014  . Right knee pain 11/30/2013  . ABDOMINAL BLOATING 10/12/2010  . ABDOMINAL PAIN OTHER SPECIFIED SITE 09/28/2010  . NAUSEA ALONE 07/04/2010   Kerin Perna, PTA 10/15/17 5:15 PM  [facility] [address], [place], [place], [address] Phone: [phone]   Fax:  [phone]  Name: Joanna Barker MRN: [id number] Date of Birth: 09/20/1971

## 2017-10-15 NOTE — Telephone Encounter (Signed)
-----   Message from Salvadore Dom, MD sent at 10/15/2017 12:49 PM EST ----- Spoke with the patient, reviewed above. Will work on getting her in for the biopsy. She has an appointment with Dr Denman George on Friday.  CC: Dr Denman George

## 2017-10-16 ENCOUNTER — Telehealth: Payer: Self-pay | Admitting: Obstetrics and Gynecology

## 2017-10-16 NOTE — Telephone Encounter (Signed)
Patient placed in hold.  Cc: Dr.Rossi  Routing to provider for final review. Patient agreeable to disposition. Will close encounter.

## 2017-10-16 NOTE — Telephone Encounter (Signed)
Joanna Barker with [facility] radiology called and states patient is scheduled for biopsy 10/22/16 @ 11am at [facility] and patient is aware

## 2017-10-17 ENCOUNTER — Encounter: Payer: Self-pay | Admitting: Gynecologic Oncology

## 2017-10-17 ENCOUNTER — Ambulatory Visit: Payer: 59 | Attending: Gynecologic Oncology | Admitting: Gynecologic Oncology

## 2017-10-17 VITALS — BP 118/67 | HR 76 | Temp 98.5°F | Ht 67.0 in | Wt 186.2 lb

## 2017-10-17 DIAGNOSIS — Z8542 Personal history of malignant neoplasm of other parts of uterus: Secondary | ICD-10-CM

## 2017-10-17 DIAGNOSIS — K6289 Other specified diseases of anus and rectum: Secondary | ICD-10-CM

## 2017-10-17 NOTE — Progress Notes (Signed)
Consult Note: Gyn-Onc  Consult was requested by Dr. Talbert Nan for the evaluation of Joanna Barker 46 y.o. female  CC:  Chief Complaint  Patient presents with  . Adnexal mass    New patient    Assessment/Plan:  Joanna Barker  is a 46 y.o.  year old with a 3cm mass in the left perirectal space in the setting of a history of stage IA grade 1 endometrial cancer in 2010. She also has a history of fibroids and myomectomy.  I reviewed the MRI images with the patient and discussed potential scenarios.  I agree with the plan for biopsy of the mass as it is possible that this represents either a recurrence of her endometrial cancer or a benign leiomyoma (extrauterine). If the latter, given that it is asymptomatic, surgical resection is not mandated and expectant management can be an option, reserving surgery if it demonstrates growth and mass effect. If recurrent endometrial cancer is confirmed, we would obtain CT imaging of the chest, abdomen and pelvis to rule out disseminated disease, followed by therapeutic options including either primary salvage radiation (+/- chemotherapy) versus surgical resection followed by radiation.   I will have Joanna Barker return to see me in the office after the biopsy results are back so that we can discuss these options further.  HPI: Joanna Barker is a 46 year old P0 who was seen in consultation at the request of Dr. Talbert Nan for a left perirectal mass and a history of endometrial cancer.  The patient's endometrial cancer history began in 2009 when, as part of infertility workup she was diagnosed with endometrial polyps that were biopsied and noted to be hyperplastic.  She was treated with progesterone therapy for several months and repeat biopsies demonstrated grade 1 endometrial cancer.  A decision was then made to proceed with Hysterectomy which took place in May of 2010 with Dr. Maye Hides at [facility].  She underwent hysterectomy with left salpingo-oophorectomy  laparoscopically.  No lymphadenectomy was performed.  The right tube and ovary were preserved due to her premenopausal status.  Final pathology confirmed a stage I a grade 1 endometrial cancer with low risk factors and therefore no adjuvant therapy was prescribed.  She engaged in the appropriate period of surveillance postoperatively and had remained cancer free. Genetic assessment was negative for hereditary cancer syndrome.  In October 2018 the patient who is an avid runner began noticing intermittent right pelvic pains when she was running.  She reported this to her OB/GYN who saw her in the office and performed a transvaginal ultrasound scan which demonstrated what appeared to be a left ovary.  Due to her surgical history of oophorectomy at this prompted an MRI which was performed on 10/13/2017.  This demonstrated a well-circumscribed left perirectal mass measuring 2.8 x 3.0 x 2.6 cm demonstrating mildly heterogeneous predominantly low T2 signal.  Following contrast this lesion demonstrates homogeneous enhancement.  No other pelvic masses are seen.  Differential diagnosis included recurrence of endometrial cancer versus leiomyoma.  The patient has a remote history of an ex lap and abdominal myomectomy in 2008 also performed at [facility].  She is a healthy woman with no major medical concerns she is a runner.  She takes no regular medications.  She has had no other prior abdominal surgeries other than that listed above.  She is asymptomatic from this mass with no pain, no rectal vaginal bleeding, no difficulty with defecation or narrowing of the caliber of stools, no dyspareunia.  Current Meds:  No  outpatient encounter medications on file as of 10/17/2017.   No facility-administered encounter medications on file as of 10/17/2017.     Allergy: No Known Allergies  Social Hx:   Social History   Socioeconomic History  . Marital status: Married    Spouse name: Not on file  . Number of  children: Not on file  . Years of education: Not on file  . Highest education level: Not on file  Social Needs  . Financial resource strain: Not on file  . Food insecurity - worry: Not on file  . Food insecurity - inability: Not on file  . Transportation needs - medical: Not on file  . Transportation needs - non-medical: Not on file  Occupational History  . Not on file  Tobacco Use  . Smoking status: Never Smoker  . Smokeless tobacco: Never Used  Substance and Sexual Activity  . Alcohol use: Yes    Alcohol/week: 1.8 - 2.4 oz    Types: 3 - 4 Standard drinks or equivalent per week  . Drug use: No  . Sexual activity: Yes    Partners: Male    Birth control/protection: Surgical    Comment: hysterectomy   Other Topics Concern  . Not on file  Social History Narrative  . Not on file    Past Surgical Hx:  Past Surgical History:  Procedure Laterality Date  . ABDOMINAL HYSTERECTOMY    . BREAST LUMPECTOMY Right 2007  . BREAST SURGERY     lumpectomy   . HYSTEROSCOPY    . MYOMECTOMY  2008  . OOPHORECTOMY Left 2010  . PELVIC LAPAROSCOPY      Past Medical Hx:  Past Medical History:  Diagnosis Date  . Cancer ([place])   . Endometrial cancer ([place]) 2010    Past Gynecological History:  G0 No LMP recorded. Patient has had a hysterectomy.  Family Hx:  Family History  Problem Relation Age of Onset  . Hypertension Mother   . Hypertension Father   . Rheum arthritis Father   . Diabetes Neg Hx   . Heart attack Neg Hx   . Hyperlipidemia Neg Hx   . Sudden death Neg Hx     Review of Systems:  Constitutional  Feels well,    ENT Normal appearing ears and nares bilaterally Skin/Breast  No rash, sores, jaundice, itching, dryness Cardiovascular  No chest pain, shortness of breath, or edema  Pulmonary  No cough or wheeze.  Gastro Intestinal  No nausea, vomitting, or diarrhoea. No bright red blood per rectum, no abdominal pain, change in bowel movement, or constipation.  Genito  Urinary  No frequency, urgency, dysuria, no pain or bleeding Musculo Skeletal  No myalgia, arthralgia, joint swelling or pain  Neurologic  No weakness, numbness, change in gait,  Psychology  No depression, anxiety, insomnia.   Vitals:  Blood pressure 118/67, pulse 76, temperature 98.5 F (36.9 C), temperature source Oral, height 5\' 7"  (1.702 m), weight 186 lb 3.2 oz (84.5 kg), SpO2 100 %.  Physical Exam: WD in NAD Neck  Supple NROM, without any enlargements.  Lymph Node Survey No cervical supraclavicular or inguinal adenopathy Cardiovascular  Pulse normal rate, regularity and rhythm. S1 and S2 normal.  Lungs  Clear to auscultation bilateraly, without wheezes/crackles/rhonchi. Good air movement.  Skin  No rash/lesions/breakdown  Psychiatry  Alert and oriented to person, place, and time  Abdomen  Normoactive bowel sounds, abdomen soft, non-tender and nonobese without evidence of hernia.  Back No CVA tenderness Genito Urinary  Vulva/vagina: Normal external female genitalia.   No lesions. No discharge or bleeding.  Bladder/urethra:  No lesions or masses, well supported bladder  Vagina: normal  Cervix and uterus: surgically absent  Adnexa: no palpable masses. Rectal  Good tone, no masses no cul de sac nodularity. Mass not appreciated on rectal exam Extremities  No bilateral cyanosis, clubbing or edema.   Donaciano Eva, MD  10/17/2017, 9:54 AM

## 2017-10-17 NOTE — Patient Instructions (Signed)
We will await the results of your biopsy and then notify you with the plan moving forward.  Please give our office a call for any questions or concerns.

## 2017-10-20 ENCOUNTER — Ambulatory Visit: Payer: 59 | Admitting: Family Medicine

## 2017-10-20 ENCOUNTER — Other Ambulatory Visit: Payer: Self-pay | Admitting: Radiology

## 2017-10-20 ENCOUNTER — Encounter: Payer: Self-pay | Admitting: Family Medicine

## 2017-10-20 DIAGNOSIS — S99911D Unspecified injury of right ankle, subsequent encounter: Secondary | ICD-10-CM | POA: Diagnosis not present

## 2017-10-20 NOTE — Patient Instructions (Signed)
Do home exercises for another 4-6 weeks. I'd do therapy at most for 2 more weeks, as little as 1 more visit. Call me if you have any problems otherwise follow up as needed. Good luck!

## 2017-10-20 NOTE — Progress Notes (Signed)
PCP: Kathyrn Lass, MD  Subjective:   HPI: Patient is a 46 y.o. female here for right ankle injury.  11/15: [place] and inverted her right ankle. Immediate pain but able to jog a little and walk back to her car about 1 1/2 miles. She did feel a pop when inverted. Has been icing, elevating, wearing brace, taking advil. Pain level 4/10 but up to 8/10 and sharp laterally. Worse with walking and after night of sleep this morning. No skin changes, numbness.  11/29: Patient reports she is making progress with her right ankle. Pain level is 2/10 but up to 6/10 and sharp at times laterally. She work the boot for about a week and switched to an ASO which she is wearing today. Doing home exercises. Was able to bike yesterday for the first time. No skin changes, swelling has improved.  12/31: Patient reports she is doing well and improving. Doing physical therapy and home exercises. Pain is lateral right ankle, achy. Doing walk:jog program - has done twice now - ankle just feels weak and tired. No skin changes, numbness.  Past Medical History:  Diagnosis Date  . Cancer ([place])   . Endometrial cancer ([place]) 2010    No current outpatient medications on file prior to visit.   No current facility-administered medications on file prior to visit.     Past Surgical History:  Procedure Laterality Date  . ABDOMINAL HYSTERECTOMY    . BREAST LUMPECTOMY Right 2007  . BREAST SURGERY     lumpectomy   . HYSTEROSCOPY    . MYOMECTOMY  2008  . OOPHORECTOMY Left 2010  . PELVIC LAPAROSCOPY      No Known Allergies  Social History   Socioeconomic History  . Marital status: Married    Spouse name: Not on file  . Number of children: Not on file  . Years of education: Not on file  . Highest education level: Not on file  Social Needs  . Financial resource strain: Not on file  . Food insecurity - worry: Not on file   . Food insecurity - inability: Not on file  . Transportation needs - medical: Not on file  . Transportation needs - non-medical: Not on file  Occupational History  . Not on file  Tobacco Use  . Smoking status: Never Smoker  . Smokeless tobacco: Never Used  Substance and Sexual Activity  . Alcohol use: Yes    Alcohol/week: 1.8 - 2.4 oz    Types: 3 - 4 Standard drinks or equivalent per week  . Drug use: No  . Sexual activity: Yes    Partners: Male    Birth control/protection: Surgical    Comment: hysterectomy   Other Topics Concern  . Not on file  Social History Narrative  . Not on file    Family History  Problem Relation Age of Onset  . Hypertension Mother   . Hypertension Father   . Rheum arthritis Father   . Diabetes Neg Hx   . Heart attack Neg Hx   . Hyperlipidemia Neg Hx   . Sudden death Neg Hx     BP 106/73   Pulse 81   Ht 5\' 7"  (1.702 m)   Wt 182 lb (82.6 kg)   BMI 28.51 kg/m   Review of Systems: See HPI above.     Objective:  Physical Exam:  Gen: NAD, comfortable in exam room.  Right ankle:  No gross deformity, swelling, ecchymoses FROM with 5/5 strength. No TTP Negative ant drawer and talar tilt.   Negative syndesmotic compression. Thompsons test negative. NV intact distally.  Assessment & Plan:  1. Right ankle injury - much improved from lateral ankle sprain.  Transition to home exercise program from physical therapy.  Do exercises for 4-6 more weeks.  Tylenol, aleve only if needed.  F/u prn.

## 2017-10-20 NOTE — Assessment & Plan Note (Signed)
much improved from lateral ankle sprain.  Transition to home exercise program from physical therapy.  Do exercises for 4-6 more weeks.  Tylenol, aleve only if needed.  F/u prn.

## 2017-10-22 ENCOUNTER — Encounter (HOSPITAL_COMMUNITY): Payer: Self-pay

## 2017-10-22 ENCOUNTER — Ambulatory Visit (HOSPITAL_COMMUNITY)
Admission: RE | Admit: 2017-10-22 | Discharge: 2017-10-22 | Disposition: A | Discharge status: Home / Self Care | Payer: 59 | Source: Ambulatory Visit | Attending: Obstetrics and Gynecology | Admitting: Obstetrics and Gynecology

## 2017-10-22 DIAGNOSIS — R1900 Intra-abdominal and pelvic swelling, mass and lump, unspecified site: Secondary | ICD-10-CM

## 2017-10-22 DIAGNOSIS — D367 Benign neoplasm of other specified sites: Secondary | ICD-10-CM | POA: Insufficient documentation

## 2017-10-22 DIAGNOSIS — Z8542 Personal history of malignant neoplasm of other parts of uterus: Secondary | ICD-10-CM | POA: Insufficient documentation

## 2017-10-22 DIAGNOSIS — N9489 Other specified conditions associated with female genital organs and menstrual cycle: Secondary | ICD-10-CM | POA: Diagnosis not present

## 2017-10-22 DIAGNOSIS — M7989 Other specified soft tissue disorders: Secondary | ICD-10-CM | POA: Diagnosis not present

## 2017-10-22 LAB — CBC WITH DIFFERENTIAL/PLATELET
BASOS ABS: 0 10*3/uL (ref 0.0–0.1)
Basophils Relative: 1 %
Eosinophils Absolute: 0.1 10*3/uL (ref 0.0–0.7)
Eosinophils Relative: 2 %
HEMATOCRIT: 37 % (ref 36.0–46.0)
HEMOGLOBIN: 12.3 g/dL (ref 12.0–15.0)
LYMPHS PCT: 35 %
Lymphs Abs: 1.5 10*3/uL (ref 0.7–4.0)
MCH: 30.7 pg (ref 26.0–34.0)
MCHC: 33.2 g/dL (ref 30.0–36.0)
MCV: 92.3 fL (ref 78.0–100.0)
MONO ABS: 0.2 10*3/uL (ref 0.1–1.0)
Monocytes Relative: 6 %
NEUTROS ABS: 2.3 10*3/uL (ref 1.7–7.7)
Neutrophils Relative %: 56 %
Platelets: 234 10*3/uL (ref 150–400)
RBC: 4.01 MIL/uL (ref 3.87–5.11)
RDW: 12.6 % (ref 11.5–15.5)
WBC: 4.1 10*3/uL (ref 4.0–10.5)

## 2017-10-22 LAB — PROTIME-INR
INR: 0.87
Prothrombin Time: 11.7 seconds (ref 11.4–15.2)

## 2017-10-22 MED ORDER — MIDAZOLAM HCL 2 MG/2ML IJ SOLN
INTRAMUSCULAR | Status: AC | PRN
  Administered 2017-10-22 (×2): 1 mg via INTRAVENOUS

## 2017-10-22 MED ORDER — SODIUM CHLORIDE 0.9 % IV SOLN
INTRAVENOUS | Status: DC
  Administered 2017-10-22: 11:00:00 via INTRAVENOUS

## 2017-10-22 MED ORDER — FENTANYL CITRATE (PF) 100 MCG/2ML IJ SOLN
INTRAMUSCULAR | Status: AC | PRN
  Administered 2017-10-22 (×2): 50 ug via INTRAVENOUS

## 2017-10-22 MED ORDER — MIDAZOLAM HCL 2 MG/2ML IJ SOLN
INTRAMUSCULAR | Status: AC
  Filled 2017-10-22: qty 6

## 2017-10-22 MED ORDER — FENTANYL CITRATE (PF) 100 MCG/2ML IJ SOLN
INTRAMUSCULAR | Status: AC
  Filled 2017-10-22: qty 4

## 2017-10-22 MED ORDER — LIDOCAINE-EPINEPHRINE 1 %-1:100000 IJ SOLN
INTRAMUSCULAR | Status: AC
  Filled 2017-10-22: qty 1

## 2017-10-22 NOTE — Sedation Documentation (Signed)
Patient is resting comfortably. 

## 2017-10-22 NOTE — Sedation Documentation (Signed)
Bandaid L gluteal intact

## 2017-10-22 NOTE — H&P (Signed)
Chief Complaint: Patient was seen in consultation today for left pelvic mass biopsy at the request of Salvadore Dom  Referring Physician(s): Salvadore Dom  Supervising Physician: Sandi Mariscal  Patient Status: [facility] - Out-pt  History of Present Illness: Joanna Barker is a 47 y.o. female   Hx endometrial cancer 2010 New abd pain; worsening MRI 10/13/2017 IMPRESSION: 1. Enhancing left perirectal mass appears removed from the left adnexa (and the patient is reportedly status post hysterectomy and left oophorectomy). Metastatic disease from the patient's endometrial cancer is certainly possible. Given the low T2 signal, benign explanations such as leiomyoma, myofibroma or gastrointestinal stromal tumor are possible as well. Tissue sampling recommended. This should be amenable to percutaneous transgluteal biopsy. 2. No other pelvic masses. 3. Right vaginal cyst.  Dr Denman George note 10/17/17: In October 2018 the patient who is an avid runner began noticing intermittent right pelvic pains when she was running.  She reported this to her OB/GYN who saw her in the office and performed a transvaginal ultrasound scan which demonstrated what appeared to be a left ovary.  Due to her surgical history of oophorectomy at this prompted an MRI which was performed on 10/13/2017.  This demonstrated a well-circumscribed left perirectal mass measuring 2.8 x 3.0 x 2.6 cm demonstrating mildly heterogeneous predominantly low T2 signal.  Following contrast this lesion demonstrates homogeneous enhancement.  No other pelvic masses are seen.  Differential diagnosis included recurrence of endometrial cancer versus leiomyoma.  Scheduled for left perirectal mass biopsy  Past Medical History:  Diagnosis Date  . Cancer ([place])   . Endometrial cancer ([place]) 2010    Past Surgical History:  Procedure Laterality Date  . ABDOMINAL HYSTERECTOMY    . BREAST LUMPECTOMY Right 2007  . BREAST SURGERY     lumpectomy   . HYSTEROSCOPY    . MYOMECTOMY  2008  . OOPHORECTOMY Left 2010  . PELVIC LAPAROSCOPY      Allergies: Patient has no known allergies.  Medications: Prior to Admission medications   Not on File     Family History  Problem Relation Age of Onset  . Hypertension Mother   . Hypertension Father   . Rheum arthritis Father   . Diabetes Neg Hx   . Heart attack Neg Hx   . Hyperlipidemia Neg Hx   . Sudden death Neg Hx     Social History   Socioeconomic History  . Marital status: Married    Spouse name: None  . Number of children: None  . Years of education: None  . Highest education level: None  Social Needs  . Financial resource strain: None  . Food insecurity - worry: None  . Food insecurity - inability: None  . Transportation needs - medical: None  . Transportation needs - non-medical: None  Occupational History  . None  Tobacco Use  . Smoking status: Never Smoker  . Smokeless tobacco: Never Used  Substance and Sexual Activity  . Alcohol use: Yes    Alcohol/week: 1.8 - 2.4 oz    Types: 3 - 4 Standard drinks or equivalent per week  . Drug use: No  . Sexual activity: Yes    Partners: Male    Birth control/protection: Surgical    Comment: hysterectomy   Other Topics Concern  . None  Social History Narrative  . None    Review of Systems: A 12 point ROS discussed and pertinent positives are indicated in the HPI above.  All other systems are negative.  Review of Systems  Constitutional: Negative for activity change, fatigue and fever.  Respiratory: Negative for cough and shortness of breath.   Gastrointestinal: Positive for abdominal pain.  Neurological: Negative for weakness.  Psychiatric/Behavioral: Negative for behavioral problems and confusion.    Vital Signs: BP (!) 142/87 (BP Location: Right Arm)   Pulse 61   Temp 97.8 F (36.6 C) (Oral)   Ht 5\' 7"  (1.702 m)   Wt 180 lb (81.6 kg)   SpO2 100%   BMI 28.19 kg/m   Physical Exam    Constitutional: She is oriented to person, place, and time.  Cardiovascular: Normal rate, regular rhythm and normal heart sounds.  Pulmonary/Chest: Effort normal and breath sounds normal.  Abdominal: Soft. Bowel sounds are normal.  Musculoskeletal: Normal range of motion.  Neurological: She is alert and oriented to person, place, and time.  Skin: Skin is warm and dry.  Psychiatric: She has a normal mood and affect. Her behavior is normal. Judgment and thought content normal.  Nursing note and vitals reviewed.   Imaging: Mr Pelvis W Wo Contrast  Result Date: 10/13/2017 CLINICAL DATA:  Left adnexal mass on outside pelvic ultrasound. History of total hysterectomy and left oophorectomy in 2010. History of endometrial cancer. EXAM: MRI PELVIS WITHOUT AND WITH CONTRAST TECHNIQUE: Multiplanar multisequence MR imaging of the pelvis was performed both before and after administration of intravenous contrast. CONTRAST:  45mL MULTIHANCE GADOBENATE DIMEGLUMINE 529 MG/ML IV SOLN COMPARISON:  Pelvic ultrasound 10/07/2017 from [facility]. FINDINGS: Urinary Tract: The visualized distal ureters and bladder appear unremarkable. Bowel: No bowel wall thickening, distention or surrounding inflammation identified within the pelvis. Vascular/Lymphatic: No enlarged pelvic lymph nodes identified. Small inguinal lymph nodes. No significant vascular findings. Reproductive: Uterus:  Hysterectomy. Cervix/Vagina: The cervix appears normal. There is a 10 mm cyst within the posterior right wall of the vagina. Right ovary: Appears normal, measuring 4.2 x 2.5 x 2.2 cm. There is a small right ovarian follicle. Right adnexal susceptibility artifact attributed to previous surgery. Left ovary: No definite normal ovarian tissue seen in the left adnexa. Surgical history indicates previous left oophorectomy. Other: There is a well-circumscribed left perirectal mass which measures 2.8 x 3.0 x 2.6 cm. This demonstrates  mildly heterogeneous, predominately low T2 signal. T1 signal is homogeneous without definite fat signal. Following contrast, this lesion demonstrates homogeneous enhancement. No other pelvic masses are seen. Small amount of pelvic ascites. Musculoskeletal: No acute or worrisome osseous findings. IMPRESSION: 1. Enhancing left perirectal mass appears removed from the left adnexa (and the patient is reportedly status post hysterectomy and left oophorectomy). Metastatic disease from the patient's endometrial cancer is certainly possible. Given the low T2 signal, benign explanations such as leiomyoma, myofibroma or gastrointestinal stromal tumor are possible as well. Tissue sampling recommended. This should be amenable to percutaneous transgluteal biopsy. 2. No other pelvic masses. 3. Right vaginal cyst. Electronically Signed   By: Richardean Sale M.D.   On: 10/13/2017 11:07   US Pelvis Transvanginal Non-ob (tv Only)  Result Date: 10/07/2017 SEE PROGRESS NOTE   Labs:  CBC: Recent Labs    09/26/17 1409  WBC 6.3  HGB 13.1  HCT 39.5  PLT 290    COAGS: No results for input(s): INR, APTT in the last 8760 hours.  BMP: Recent Labs    09/26/17 1409  NA 140  K 4.2  CL 102  CO2 25  GLUCOSE 91  BUN 13  CALCIUM 9.1  CREATININE 0.79  GFRNONAA 91  GFRAA 105  LIVER FUNCTION TESTS: Recent Labs    09/26/17 1409  BILITOT 0.3  AST 14  ALT 14  ALKPHOS 29*  PROT 6.7  ALBUMIN 4.3    TUMOR MARKERS: No results for input(s): AFPTM, CEA, CA199, CHROMGRNA in the last 8760 hours.  Assessment and Plan:  Hx endometrial cancer 2010 New pelvic pain Perirectal mass on MRI Now for biopsy Risks and benefits discussed with the patient including, but not limited to bleeding, infection, damage to adjacent structures or low yield requiring additional tests. All of the patient's questions were answered, patient is agreeable to proceed. Consent signed and in chart.   Thank you for this  interesting consult.  I greatly enjoyed meeting Joanna Barker and look forward to participating in their care.  A copy of this report was sent to the requesting provider on this date.  Electronically Signed: Lavonia Drafts, PA-C 10/22/2017, 10:33 AM   I spent a total of  30 Minutes   in face to face in clinical consultation, greater than 50% of which was counseling/coordinating care for left pelvic mass bx

## 2017-10-22 NOTE — Discharge Instructions (Signed)
Needle Biopsy, Care After °These instructions give you information about caring for yourself after your procedure. Your doctor may also give you more specific instructions. Call your doctor if you have any problems or questions after your procedure. °Follow these instructions at home: °· Rest as told by your doctor. °· Take medicines only as told by your doctor. °· There are many different ways to close and cover the biopsy site, including stitches (sutures), skin glue, and adhesive strips. Follow instructions from your doctor about: °? How to take care of your biopsy site. °? When and how you should change your bandage (dressing). °? When you should remove your dressing. °? Removing whatever was used to close your biopsy site. °· Check your biopsy site every day for signs of infection. Watch for: °? Redness, swelling, or pain. °? Fluid, blood, or pus. °Contact a doctor if: °· You have a fever. °· You have redness, swelling, or pain at the biopsy site, and it lasts longer than a few days. °· You have fluid, blood, or pus coming from the biopsy site. °· You feel sick to your stomach (nauseous). °· You throw up (vomit). °Get help right away if: °· You are short of breath. °· You have trouble breathing. °· Your chest hurts. °· You feel dizzy or you pass out (faint). °· You have bleeding that does not stop with pressure or a bandage. °· You cough up blood. °· Your belly (abdomen) hurts. °This information is not intended to replace advice given to you by your health care provider. Make sure you discuss any questions you have with your health care provider. °Document Released: 09/19/2008 Document Revised: 03/14/2016 Document Reviewed: 10/03/2014 °Elsevier Interactive Patient Education © 2018 [employer] Inc[employer] ° °

## 2017-10-22 NOTE — Procedures (Signed)
Interventional Radiology Procedure Note  Procedure:  CT guided biopsy of left pelvic nodule. Transgluteal approach.  Endometrial cancer history.   Complications: None Recommendations:  - Supine recovery - recovery for 1 hour, then DC - Advance Diet - Do not submerge for 7 days - Routine wound care  - Follow up pathology  Signed,  Dulcy Fanny. Earleen Newport, DO

## 2017-10-23 ENCOUNTER — Other Ambulatory Visit: Payer: Self-pay | Admitting: Obstetrics and Gynecology

## 2017-10-23 ENCOUNTER — Encounter: Payer: Self-pay | Admitting: Physical Therapy

## 2017-10-23 ENCOUNTER — Ambulatory Visit: Payer: 59 | Attending: Family Medicine | Admitting: Physical Therapy

## 2017-10-23 DIAGNOSIS — M25671 Stiffness of right ankle, not elsewhere classified: Secondary | ICD-10-CM | POA: Insufficient documentation

## 2017-10-23 DIAGNOSIS — R2681 Unsteadiness on feet: Secondary | ICD-10-CM | POA: Insufficient documentation

## 2017-10-23 DIAGNOSIS — M25571 Pain in right ankle and joints of right foot: Secondary | ICD-10-CM | POA: Insufficient documentation

## 2017-10-23 DIAGNOSIS — R1900 Intra-abdominal and pelvic swelling, mass and lump, unspecified site: Secondary | ICD-10-CM

## 2017-10-23 DIAGNOSIS — R600 Localized edema: Secondary | ICD-10-CM | POA: Insufficient documentation

## 2017-10-23 NOTE — Therapy (Signed)
[facility] [address] [place], [place], [address] Phone: [phone]   Fax:  [phone]  Physical Therapy Treatment  Patient Details  Name: Joanna Barker MRN: [id number] Date of Birth: [birth year] Referring Provider: Dr. Barbaraann Barthel    Encounter Date: 10/23/2017  PT End of Session - 10/23/17 0834    Visit Number  7    Number of Visits  17    Date for PT Re-Evaluation  11/19/17    Authorization Type  [employer] healthcare    PT Start Time  0800    PT Stop Time  0845    PT Time Calculation (min)  45 min    Activity Tolerance  Patient tolerated treatment well    Behavior During Therapy  [facility] for tasks assessed/performed       Past Medical History:  Diagnosis Date  . Cancer ([place])   . Endometrial cancer ([place]) 2010    Past Surgical History:  Procedure Laterality Date  . ABDOMINAL HYSTERECTOMY    . BREAST LUMPECTOMY Right 2007  . BREAST SURGERY     lumpectomy   . HYSTEROSCOPY    . MYOMECTOMY  2008  . OOPHORECTOMY Left 2010  . PELVIC LAPAROSCOPY      There were no vitals filed for this visit.  Subjective Assessment - 10/23/17 0802    Subjective  Pt reports that things are going well. She was able to do some jogging intervals on the treadmill since her last session. She was able to even jog outside without any difficulty. She feels 95% improved since she started therapy.     Pertinent History  1/2 marathon in mid January    How long can you sit comfortably?  unlimited     How long can you stand comfortably?  unlimited     How long can you walk comfortably?  unlimited     Currently in Pain?  No/denies         [facility] PT Assessment - 10/23/17 0001      Functional Tests   Functional tests  Other      Other:   Other/ Comments  Anterior reach test: Rt 52.3 cm, Lt 59.6 cm      AROM   Overall AROM Comments  Closed chain ankle DF: Rt 30 deg improved to 34 post treatment, Lt 34 deg     Right Ankle Dorsiflexion   7    Right Ankle Inversion  25    Right Ankle Eversion  20      Strength   Right Ankle Dorsiflexion  5/5    Right Ankle Plantar Flexion  5/5    Right Ankle Inversion  5/5    Right Ankle Eversion  5/5      High Level Balance   High Level Balance Comments  SLS eyes open/firm: 30 sec each; Eyes open/foam: Lt 30 sec, 25 sec; eyes closed/firm: 10 sec Lt, 15 sec on Rt (increased postural sway)                  [facility] Adult PT Treatment/Exercise - 10/23/17 0001      Ankle Exercises: Standing   SLS  EO/EC x20 sec each LE, on foam and firm surface     Heel Raises  20 reps;Limitations    Heel Raises Limitations  RLE only on step; BLE heel raises with Rt lateral heel pull 2x20 reps with red TB     Other Standing Ankle Exercises  SLS on BOSU, RLE  only x20 sec              PT Education - 10/23/17 0840    Education provided  Yes    Education Details  implication for anterior reach test; updates to HEP and goals/progress    Person(s) Educated  Patient    Methods  Explanation    Comprehension  Verbalized understanding       PT Short Term Goals - 10/23/17 0810      PT SHORT TERM GOAL #1   Title  Pt will demo consistency and independence with her HEP to decrease ankle pain and swelling.     Time  4    Period  Weeks    Status  Achieved      PT SHORT TERM GOAL #2   Title  Pt will demo improved Rt ankle active dorsiflexion ROM to atleast 10 deg, which will assist with foot clearance during ambulation.     Baseline  7    Time  4    Period  Weeks    Status  Partially Met      PT SHORT TERM GOAL #3   Title  Pt will ambulate throughout her session with proper heel strike, step length and push off on the Rt, without therapist cuing, to decrease the risk of other compensations forming.     Time  4    Period  Weeks    Status  Achieved        PT Long Term Goals - 10/23/17 5188      PT LONG TERM GOAL #1   Title  Pt will demo improved Rt ankle plantar flexion strength  evident by her ability to complete atleast 20 single leg heel raises, to assist with return to running.     Time  6    Period  Weeks    Status  Achieved      PT LONG TERM GOAL #2   Title  Pt will demo anterior reach on the Rt within 4cm of the Lt in order to reflect improved symmetry between LEs for return to jogging.     Time  8    Period  Weeks    Status  On-going      PT LONG TERM GOAL #3   Title  Pt will maintain BLE single leg stance on a foam pad/eyes open for atleast 15 sec (2/3 trials) without significant postural sway, in order to reflect an improvement in her ankle proprioception and stability.     Time  8    Status  Achieved      PT LONG TERM GOAL #4   Title  Pt will report atleast 75% improvement in ankle pain and stiffness with daily activity from the start of PT, to increase her confidence with walking around her home/work without an ankle brace support.     Baseline  95% improved     Time  8    Period  Weeks    Status  Achieved      PT LONG TERM GOAL #5   Title  Pt will return to jogging atleast 50% of her prior distance with no more than 3/10 discomfort or pain, in order to help facilitate her return to trianing for races.     Baseline  only up to 3 miles, compared to 10 miles prior to injury     Time  8    Period  Weeks    Status  Partially Met  Plan - 10/23/17 0843    Clinical Impression Statement  Pt is making steady progress towards her goals, noting 95% improvement overall in her pain/stiffness. She does have mild limitations in Rt ankle DF AROM which was improved following end of session. Also noted greater than 4 cm difference between Lt and Rt LEs during anterior reach functional testing which indicates asymmetries between LEs. Therapist made several updates to pt's HEP and she demonstrated good understanding at this time. Will likely plan on next session being her last if no new issues/concerns are noted.     Rehab Potential  Good    PT  Frequency  2x / week    PT Duration  8 weeks    PT Treatment/Interventions  ADLs/Self Care Home Management;Cryotherapy;Electrical Stimulation;Ultrasound;Moist Heat;Therapeutic activities;Therapeutic exercise;Functional mobility training;Stair training;Gait training;Balance training;Neuromuscular re-education;Orthotic Fit/Training;Patient/family education;Manual techniques;Passive range of motion;Dry needling;Taping    PT Next Visit Plan  d/c if pt has no issues/concerns; proprioception/ankle mobility exercise     Consulted and Agree with Plan of Care  Patient       Patient will benefit from skilled therapeutic intervention in order to improve the following deficits and impairments:  Abnormal gait, Decreased activity tolerance, Decreased balance, Difficulty walking, Increased edema, Impaired flexibility, Hypomobility, Decreased strength, Decreased endurance, Decreased range of motion, Decreased mobility, Pain, Improper body mechanics  Visit Diagnosis: Pain in right ankle and joints of right foot  Stiffness of right ankle, not elsewhere classified  Unsteadiness on feet  Localized edema     Problem List Patient Active Problem List   Diagnosis Date Noted  . Mass of perirectal soft tissue 10/17/2017  . Right ankle injury, subsequent encounter 09/04/2017  . Plantar fasciitis of right foot 10/03/2016  . History of endometrial cancer 09/17/2016  . Left knee injury 06/27/2015  . Anxiety 09/21/2014  . Right knee pain 11/30/2013  . ABDOMINAL BLOATING 10/12/2010  . ABDOMINAL PAIN OTHER SPECIFIED SITE 09/28/2010  . NAUSEA ALONE 07/04/2010   8:53 AM,10/23/17 Elly Modena PT, DPT [facility] [address], [place], [place], [address] Phone: [phone]   Fax:  [phone]  Name: Joanna Barker MRN: [id number] Date of Birth: 12-Jul-1971

## 2017-10-23 NOTE — Patient Instructions (Signed)
   BOSU - SINGLE LEG STANCE  Stand on a [employer] with one leg and maintain your balance.  Maintain a slightly bent knee on the stance side.       RHOMBERG STANCE - SINGLE LEG - EYES CLOSED - SLS  Cross your arms over your chest and then stand on one leg.  Perform this next to a table or other sturdy object.  Hold your balance in this position...then, close your eyes. Try and hold this position with eyes closed as best you can.   If you lose your balance, you can use one or more strategies to help:  Open you eyes Touch your toes down Take a step Unfold your arms and raise them to the sides Grab on to something for support         Heel Raise on Stair- single leg  Same as before but lift full body weight on one leg only.  Up to 20-25 reps      [facility] [address] [place],  [address] Phone # [phone] Fax [phone]

## 2017-10-24 ENCOUNTER — Telehealth: Payer: Self-pay | Admitting: Gynecologic Oncology

## 2017-10-24 NOTE — Telephone Encounter (Signed)
Spoke with the patient.  She is aware of her biopsy results.  Advised her that Dr. Denman George recommends keeping follow up appt to discuss options.  Advised to call for any questions or concerns.

## 2017-10-28 ENCOUNTER — Ambulatory Visit: Payer: 59 | Admitting: Physical Therapy

## 2017-10-28 ENCOUNTER — Encounter: Payer: Self-pay | Admitting: Physical Therapy

## 2017-10-28 DIAGNOSIS — M25571 Pain in right ankle and joints of right foot: Secondary | ICD-10-CM | POA: Diagnosis not present

## 2017-10-28 DIAGNOSIS — R600 Localized edema: Secondary | ICD-10-CM

## 2017-10-28 DIAGNOSIS — M25671 Stiffness of right ankle, not elsewhere classified: Secondary | ICD-10-CM

## 2017-10-28 DIAGNOSIS — R2681 Unsteadiness on feet: Secondary | ICD-10-CM

## 2017-10-28 NOTE — Therapy (Signed)
[facility] [address] [place], [place], [address] Phone: [phone]   Fax:  [phone]  Physical Therapy Treatment  Patient Details  Name: Joanna Barker MRN: [id number] Date of Birth: 22-Aug-1971 Referring Provider: Dr. Barbaraann Barthel   Encounter Date: 10/28/2017  PT End of Session - 10/28/17 0846    Visit Number  8    Date for PT Re-Evaluation  11/19/17    Authorization Type  [employer] healthcare    Authorization Time Period  09/24/17 to 11/19/17    PT Start Time  0800    PT Stop Time  0841    PT Time Calculation (min)  41 min    Activity Tolerance  Patient tolerated treatment well    Behavior During Therapy  [facility] for tasks assessed/performed       Past Medical History:  Diagnosis Date  . Cancer ([place])   . Endometrial cancer ([place]) 2010    Past Surgical History:  Procedure Laterality Date  . ABDOMINAL HYSTERECTOMY    . BREAST LUMPECTOMY Right 2007  . BREAST SURGERY     lumpectomy   . HYSTEROSCOPY    . MYOMECTOMY  2008  . OOPHORECTOMY Left 2010  . PELVIC LAPAROSCOPY      There were no vitals filed for this visit.  Subjective Assessment - 10/28/17 0804    Subjective  Dr. Barbaraann Barthel said go to one more appointment and can be discharged. I have been doing interval runs. I had more trouble with cardio.  When I ran my calf was tight.  Going up hill I feel tightness with heel strike.  My marathon is in 2 weeks. Last weekend I ran 5 miles. I get scared to push off hard.     Pertinent History  1/2 marathon in mid January    Limitations  Walking;Other (comment)    How long can you sit comfortably?  unlimited     How long can you stand comfortably?  unlimited     How long can you walk comfortably?  unlimited     Diagnostic tests  musculoskeletal ultrasound negative; X-ray negative    Patient Stated Goals  run her half marathon in January 18th    Currently in Pain?  No/denies         [facility] PT Assessment - 10/28/17  0001      Assessment   Medical Diagnosis  Rt ankle injury, subsequent encounter     Referring Provider  Dr. Barbaraann Barthel    Next MD Visit  10/20/17      Precautions   Precautions  None      Restrictions   Weight Bearing Restrictions  No      Home Environment   Living Environment  Private residence      Prior Function   Level of Independence  Independent      Cognition   Overall Cognitive Status  Within Functional Limits for tasks assessed      AROM   Right Ankle Dorsiflexion  15 knee flexed    Right Ankle Inversion  25    Right Ankle Eversion  20      Strength   Right Ankle Dorsiflexion  5/5    Right Ankle Plantar Flexion  5/5    Right Ankle Inversion  5/5    Right Ankle Eversion  5/5                  [facility] Adult PT Treatment/Exercise - 10/28/17 0001  Manual Therapy   Manual Therapy  Joint mobilization;Soft tissue mobilization    Joint Mobilization  calcaneal rock, lateral malleolus glide post. and ant.; talus mobilization and cuboid     Soft tissue mobilization  right gastroc, right soleus, right peroneal, around right lateral malleolus      Ankle Exercises: Aerobic   Elliptical  level 3 2 min forward, 2 min backward      Ankle Exercises: Standing   Toe Walk (Round Trip)  squats to stretch ankle and calf       Trigger Point Dry Needling - 10/28/17 0844    Consent Given?  Yes    Education Handout Provided  Yes    Muscles Treated Upper Body  Gastrocnemius;Soleus    Muscles Treated Lower Body  Gastrocnemius;Soleus right, right peroneal     Gastrocnemius Response  Twitch response elicited;Palpable increased muscle length    Soleus Response  Twitch response elicited;Palpable increased muscle length           PT Education - 10/28/17 0843    Education provided  Yes    Education Details  dry needling info, calf stretches    Person(s) Educated  Patient    Methods  Explanation;Demonstration;Verbal cues;Handout    Comprehension  Returned  demonstration;Verbalized understanding       PT Short Term Goals - 10/28/17 0847      PT SHORT TERM GOAL #2   Title  Pt will demo improved Rt ankle active dorsiflexion ROM to atleast 10 deg, which will assist with foot clearance during ambulation.     Time  4    Period  Weeks    Status  Achieved        PT Long Term Goals - 10/28/17 0847      PT LONG TERM GOAL #2   Title  Pt will demo anterior reach on the Rt within 4cm of the Lt in order to reflect improved symmetry between LEs for return to jogging.     Time  8    Period  Weeks    Status  Achieved      PT LONG TERM GOAL #5   Title  Pt will return to jogging atleast 50% of her prior distance with no more than 3/10 discomfort or pain, in order to help facilitate her return to trianing for races.     Time  8    Period  Weeks    Status  Achieved            Plan - 10/28/17 0848    Clinical Impression Statement  Patient has met her goals.  She is running 6 miles and progressing for her 13 mile run. Patient has tightness with right ankle dorsiflexion and slow with push off phase of right ankle.  Patient has full right ankle ROM and strength is 5/5.  Patient had several trigger points in the right calf and peroneal muscle.  Patient is independent with her HEP .  Patient is ready for discharge today.     Rehab Potential  Good    PT Treatment/Interventions  ADLs/Self Care Home Management;Cryotherapy;Electrical Stimulation;Ultrasound;Moist Heat;Therapeutic activities;Therapeutic exercise;Functional mobility training;Stair training;Gait training;Balance training;Neuromuscular re-education;Orthotic Fit/Training;Patient/family education;Manual techniques;Passive range of motion;Dry needling;Taping    PT Next Visit Plan  Discharge to HEP today    PT Home Exercise Plan  current HEP    Consulted and Agree with Plan of Care  Patient       Patient will benefit from skilled therapeutic intervention in order to  improve the following  deficits and impairments:  Abnormal gait, Decreased activity tolerance, Decreased balance, Difficulty walking, Increased edema, Impaired flexibility, Hypomobility, Decreased strength, Decreased endurance, Decreased range of motion, Decreased mobility, Pain, Improper body mechanics  Visit Diagnosis: Pain in right ankle and joints of right foot  Stiffness of right ankle, not elsewhere classified  Unsteadiness on feet  Localized edema     Problem List Patient Active Problem List   Diagnosis Date Noted  . Mass of perirectal soft tissue 10/17/2017  . Right ankle injury, subsequent encounter 09/04/2017  . Plantar fasciitis of right foot 10/03/2016  . History of endometrial cancer 09/17/2016  . Left knee injury 06/27/2015  . Anxiety 09/21/2014  . Right knee pain 11/30/2013  . ABDOMINAL BLOATING 10/12/2010  . ABDOMINAL PAIN OTHER SPECIFIED SITE 09/28/2010  . NAUSEA ALONE 07/04/2010    Earlie Counts, PT 10/28/17 8:52 AM   [facility] [address], [place], [place], [address] Phone: [phone]   Fax:  [phone]  Name: Zineb Glade MRN: [id number] Date of Birth: 1971/09/17  PHYSICAL THERAPY DISCHARGE SUMMARY  Visits from Start of Care: 8  Current functional level related to goals / functional outcomes: See above   Remaining deficits: See above   Education / Equipment: HEP Plan: Patient agrees to discharge.  Patient goals were met. Patient is being discharged due to meeting the stated rehab goals.  Thank you for the referral. Earlie Counts, PT 10/28/17 8:52 AM  ?????

## 2017-10-28 NOTE — Patient Instructions (Addendum)
Trigger Point Dry Needling  . What is Trigger Point Dry Needling (DN)? o DN is a physical therapy technique used to treat muscle pain and dysfunction. Specifically, DN helps deactivate muscle trigger points (muscle knots).  o A thin filiform needle is used to penetrate the skin and stimulate the underlying trigger point. The goal is for a local twitch response (LTR) to occur and for the trigger point to relax. No medication of any kind is injected during the procedure.   . What Does Trigger Point Dry Needling Feel Like?  o The procedure feels different for each individual patient. Some patients report that they do not actually feel the needle enter the skin and overall the process is not painful. Very mild bleeding may occur. However, many patients feel a deep cramping in the muscle in which the needle was inserted. This is the local twitch response.   Marland Kitchen How Will I feel after the treatment? o Soreness is normal, and the onset of soreness may not occur for a few hours. Typically this soreness does not last longer than two days.  o Bruising is uncommon, however; ice can be used to decrease any possible bruising.  o In rare cases feeling tired or nauseous after the treatment is normal. In addition, your symptoms may get worse before they get better, this period will typically not last longer than 24 hours.   . What Can I do After My Treatment? o Increase your hydration by drinking more water for the next 24 hours. o You may place ice or heat on the areas treated that have become sore, however, do not use heat on inflamed or bruised areas. Heat often brings more relief post needling. o You can continue your regular activities, but vigorous activity is not recommended initially after the treatment for 24 hours. o DN is best combined with other physical therapy such as strengthening, stretching, and other therapies.    [facility] [address] [place], [place]  [address] Phone # [phone] Fax [phone]   Achilles / Soleus, Standing    Stand, right foot behind, heel on floor and turned slightly out. Lower hips and bend knees. Hold __30_ seconds. Repeat __2_ times per session. Do _1__ sessions per day.  Copyright  VHI. All rights reserved.  Achilles / Gastroc, Standing    Stand, right foot behind, heel on floor and turned slightly out, leg straight, forward leg bent. Move hips forward. Hold __30_ seconds. Repeat _2__ times per session. Do _1__ sessions per day.  Copyright  VHI. All rights reserved.  Achilles Squat    Stand, feet hip-width apart, slightly turned out. Squat as low as possible, keeping feet flat on floor. Feel stretch in heel. If possible, place elbows inside knees, palms together. Hold _15__ seconds. Beginner: Grasp a table leg while squatting to hold pose. Advanced: Feet close together, wrap arms around shins. Repeat _2__ times per session. Do __2_ sessions per day.  Copyright  VHI. All rights reserved.  [facility] [address], [place], [place] [address] Phone # [phone] Fax [phone]

## 2017-10-30 ENCOUNTER — Encounter: Payer: 59 | Admitting: Physical Therapy

## 2017-11-03 ENCOUNTER — Inpatient Hospital Stay: Payer: 59 | Attending: Gynecologic Oncology | Admitting: Gynecologic Oncology

## 2017-11-03 ENCOUNTER — Encounter: Payer: Self-pay | Admitting: Gynecologic Oncology

## 2017-11-03 VITALS — BP 112/75 | HR 68 | Temp 98.3°F | Resp 18 | Wt 186.3 lb

## 2017-11-03 DIAGNOSIS — Z79899 Other long term (current) drug therapy: Secondary | ICD-10-CM | POA: Diagnosis not present

## 2017-11-03 DIAGNOSIS — Z90710 Acquired absence of both cervix and uterus: Secondary | ICD-10-CM | POA: Diagnosis not present

## 2017-11-03 DIAGNOSIS — K6289 Other specified diseases of anus and rectum: Secondary | ICD-10-CM

## 2017-11-03 DIAGNOSIS — Z8542 Personal history of malignant neoplasm of other parts of uterus: Secondary | ICD-10-CM | POA: Diagnosis not present

## 2017-11-03 DIAGNOSIS — F40243 Fear of flying: Secondary | ICD-10-CM

## 2017-11-03 DIAGNOSIS — F419 Anxiety disorder, unspecified: Secondary | ICD-10-CM

## 2017-11-03 MED ORDER — LORAZEPAM 1 MG PO TABS: 1.0000 mg | ORAL_TABLET | Freq: Three times a day (TID) | ORAL | 0 refills | Status: AC | PRN

## 2017-11-03 NOTE — Progress Notes (Signed)
Consult Note: Gyn-Onc  Consult was requested by Dr. Talbert Nan for the evaluation of Joanna Barker 47 y.o. female  CC:  Chief Complaint  Patient presents with  . History of endometrial cancer  . Mass of perirectal soft tissue    Assessment/Plan:  [name]  is a 47 y.o.  year old with a 3cm fibroid in the left perirectal space in the setting of a history of stage IA grade 1 endometrial cancer in 2010. She also has a history of fibroids and myomectomy.  Given that this is asymptomatic, I believe it is appropriate to obtain a serial [place] in 3 months to develop an understanding of the rapidity of growth of this mass. If it remains stable in size (<50% change in size) and asymptomatic, I would not recommend surgical removal nor do I think she needs continued scheduled ultrasounds. However, if it becomes symptomatic and/or substantially increases in size, I would recommend consideration of surgical removal.  She will follow-up with Dr Talbert Nan for follow-up but has been given my contact info if she has questions.  The patient has fear of flying and an upcoming flight. She has used ativan 1mg  PO in the past. I represcribed 10 tabs with counseling regarding safe use and safe storage.  HPI: Joanna Barker is a 47 year old P0 who was seen in consultation at the request of Dr. Talbert Nan for a left perirectal mass and a history of endometrial cancer.  The patient's endometrial cancer history began in 2009 when, as part of infertility workup she was diagnosed with endometrial polyps that were biopsied and noted to be hyperplastic.  She was treated with progesterone therapy for several months and repeat biopsies demonstrated grade 1 endometrial cancer.  A decision was then made to proceed with Hysterectomy which took place in May of 2010 with Dr. Maye Hides at [facility].  She underwent hysterectomy with left salpingo-oophorectomy laparoscopically.  No lymphadenectomy was performed.  The right tube and ovary  were preserved due to her premenopausal status.  Final pathology confirmed a stage I a grade 1 endometrial cancer with low risk factors and therefore no adjuvant therapy was prescribed.  She engaged in the appropriate period of surveillance postoperatively and had remained cancer free. Genetic assessment was negative for hereditary cancer syndrome.  In October 2018 the patient who is an avid runner began noticing intermittent right pelvic pains when she was running.  She reported this to her OB/GYN who saw her in the office and performed a transvaginal ultrasound scan which demonstrated what appeared to be a left ovary.  Due to her surgical history of oophorectomy at this prompted an MRI which was performed on 10/13/2017.  This demonstrated a well-circumscribed left perirectal mass measuring 2.8 x 3.0 x 2.6 cm demonstrating mildly heterogeneous predominantly low T2 signal.  Following contrast this lesion demonstrates homogeneous enhancement.  No other pelvic masses are seen.  Differential diagnosis included recurrence of endometrial cancer versus leiomyoma.  The patient has a remote history of an ex lap and abdominal myomectomy in 2008 also performed at [facility].  She is a healthy woman with no major medical concerns she is a runner.  She takes no regular medications.  She has had no other prior abdominal surgeries other than that listed above.  She is asymptomatic from this mass with no pain, no rectal vaginal bleeding, no difficulty with defecation or narrowing of the caliber of stools, no dyspareunia.  Interval Hx:  On 10/22/17 she underwent core needle biopsy of  the mass which revealed it to be a benign leiomyoma.  It remains asymptomatic.  Current Meds:  Outpatient Encounter Medications as of 11/03/2017  Medication Sig  . LORazepam (ATIVAN) 1 MG tablet Take 1 tablet (1 mg total) by mouth every 8 (eight) hours as needed for anxiety.   No facility-administered encounter medications on file  as of 11/03/2017.     Allergy: No Known Allergies  Social Hx:   Social History   Socioeconomic History  . Marital status: Married    Spouse name: Not on file  . Number of children: Not on file  . Years of education: Not on file  . Highest education level: Not on file  Social Needs  . Financial resource strain: Not on file  . Food insecurity - worry: Not on file  . Food insecurity - inability: Not on file  . Transportation needs - medical: Not on file  . Transportation needs - non-medical: Not on file  Occupational History  . Not on file  Tobacco Use  . Smoking status: Never Smoker  . Smokeless tobacco: Never Used  Substance and Sexual Activity  . Alcohol use: Yes    Alcohol/week: 1.8 - 2.4 oz    Types: 3 - 4 Standard drinks or equivalent per week  . Drug use: No  . Sexual activity: Yes    Partners: Male    Birth control/protection: Surgical    Comment: hysterectomy   Other Topics Concern  . Not on file  Social History Narrative  . Not on file    Past Surgical Hx:  Past Surgical History:  Procedure Laterality Date  . ABDOMINAL HYSTERECTOMY    . BREAST LUMPECTOMY Right 2007  . BREAST SURGERY     lumpectomy   . HYSTEROSCOPY    . MYOMECTOMY  2008  . OOPHORECTOMY Left 2010  . PELVIC LAPAROSCOPY      Past Medical Hx:  Past Medical History:  Diagnosis Date  . Cancer ([place])   . Endometrial cancer ([place]) 2010    Past Gynecological History:  G0 No LMP recorded. Patient has had a hysterectomy.  Family Hx:  Family History  Problem Relation Age of Onset  . Hypertension Mother   . Hypertension Father   . Rheum arthritis Father   . Diabetes Neg Hx   . Heart attack Neg Hx   . Hyperlipidemia Neg Hx   . Sudden death Neg Hx     Review of Systems:  Constitutional  Feels well,    ENT Normal appearing ears and nares bilaterally Skin/Breast  No rash, sores, jaundice, itching, dryness Cardiovascular  No chest pain, shortness of breath, or edema  Pulmonary  No  cough or wheeze.  Gastro Intestinal  No nausea, vomitting, or diarrhoea. No bright red blood per rectum, no abdominal pain, change in bowel movement, or constipation.  Genito Urinary  No frequency, urgency, dysuria, no pain or bleeding Musculo Skeletal  No myalgia, arthralgia, joint swelling or pain  Neurologic  No weakness, numbness, change in gait,  Psychology  No depression, anxiety, insomnia.   Vitals:  Blood pressure 112/75, pulse 68, temperature 98.3 F (36.8 C), temperature source Oral, resp. rate 18, weight 186 lb 4.8 oz (84.5 kg), SpO2 100 %.  Physical Exam: WD in NAD Neck  Supple NROM, without any enlargements.  Lymph Node Survey No cervical supraclavicular or inguinal adenopathy Cardiovascular  Pulse normal rate, regularity and rhythm. S1 and S2 normal.  Lungs  Clear to auscultation bilateraly, without wheezes/crackles/rhonchi. Good air  movement.  Skin  No rash/lesions/breakdown  Psychiatry  Alert and oriented to person, place, and time  Abdomen  Normoactive bowel sounds, abdomen soft, non-tender and nonobese without evidence of hernia.  Back No CVA tenderness Genito Urinary  Vulva/vagina: Normal external female genitalia.   No lesions. No discharge or bleeding.  Bladder/urethra:  No lesions or masses, well supported bladder  Vagina: normal  Cervix and uterus: surgically absent  Adnexa: no palpable masses. Rectal  Good tone, no masses no cul de sac nodularity. Mass not appreciated on rectal exam Extremities  No bilateral cyanosis, clubbing or edema.   Donaciano Eva, MD  11/03/2017, 12:30 PM

## 2017-11-03 NOTE — Patient Instructions (Signed)
Please follow-up with Dr Talbert Nan in 3 months as scheduled for repeat US.  Please contact Dr Denman George if you have questions or concerns, [place].

## 2017-11-04 ENCOUNTER — Encounter: Payer: 59 | Admitting: Physical Therapy

## 2017-11-06 ENCOUNTER — Encounter: Payer: 59 | Admitting: Physical Therapy

## 2017-11-11 ENCOUNTER — Encounter: Payer: 59 | Admitting: Physical Therapy

## 2017-11-13 ENCOUNTER — Encounter: Payer: 59 | Admitting: Physical Therapy

## 2017-11-18 ENCOUNTER — Ambulatory Visit: Payer: 59 | Admitting: Physical Therapy

## 2017-11-26 DIAGNOSIS — Z1231 Encounter for screening mammogram for malignant neoplasm of breast: Secondary | ICD-10-CM | POA: Diagnosis not present

## 2018-01-08 ENCOUNTER — Telehealth: Payer: Self-pay | Admitting: Obstetrics and Gynecology

## 2018-01-08 NOTE — Telephone Encounter (Signed)
Patient called stating that at her appointment on 10/07/17, Dr. Talbert Nan had spoken with her about doing a follow up ultrasound.

## 2018-01-08 NOTE — Telephone Encounter (Signed)
Spoke with patient. PUS scheduled for 01/20/18 at 1:30pm with consult at 2pm with Dr. Talbert Nan. This is 3 mo f/u for pelvic mass, last seen by Dr. Denman George on 11/03/17, Dr. Talbert Nan 10/07/17.   Order previously placed for PUS.   Routing to provider for final review. Patient is agreeable to disposition. Will close encounter.   Cc: Lerry Liner, Magdalene Patricia

## 2018-01-20 ENCOUNTER — Encounter: Payer: Self-pay | Admitting: Obstetrics and Gynecology

## 2018-01-20 ENCOUNTER — Other Ambulatory Visit: Payer: Self-pay

## 2018-01-20 ENCOUNTER — Ambulatory Visit (INDEPENDENT_AMBULATORY_CARE_PROVIDER_SITE_OTHER): Payer: 59

## 2018-01-20 ENCOUNTER — Ambulatory Visit (INDEPENDENT_AMBULATORY_CARE_PROVIDER_SITE_OTHER): Payer: 59 | Admitting: Obstetrics and Gynecology

## 2018-01-20 VITALS — BP 121/70 | HR 72 | Resp 14 | Wt 185.0 lb

## 2018-01-20 DIAGNOSIS — D219 Benign neoplasm of connective and other soft tissue, unspecified: Secondary | ICD-10-CM

## 2018-01-20 DIAGNOSIS — R1030 Lower abdominal pain, unspecified: Secondary | ICD-10-CM | POA: Diagnosis not present

## 2018-01-20 DIAGNOSIS — R1900 Intra-abdominal and pelvic swelling, mass and lump, unspecified site: Secondary | ICD-10-CM | POA: Diagnosis not present

## 2018-01-20 DIAGNOSIS — K582 Mixed irritable bowel syndrome: Secondary | ICD-10-CM

## 2018-01-20 NOTE — Progress Notes (Signed)
GYNECOLOGY  VISIT   HPI: 47 y.o.   Married  Caucasian  female   G0P0000 with No LMP recorded. Patient has had a hysterectomy.   here for follow up on pelvic mass The paitent has a h/o adenocarcinoma of the endometrium and is s/p TLH/LSO in 5/10.   She had an ultrasound in 12/18 for pain and was noted to have a solid mass in her left adnexa with vascular flow. MRI was done and tissue biopsy was recommended. She underwent a CT guided biopsy of the lesion which returned as a fibroid. She has had a consultation with Dr Denman George She continues to have intermittent pain in the lower abdomen, can be right or left. Hasn't had it for 3 weeks. She has felt it about 20 x in the last 3+ months, can last a few hours to days. Moderately uncomfortable with the pain. Able to function. Not cyclic. She goes from constipation to diarrhea.    GYNECOLOGIC HISTORY: No LMP recorded. Patient has had a hysterectomy. Contraception:hysterectomy  Menopausal hormone therapy: none         OB History    Gravida  0   Para  0   Term  0   Preterm  0   AB  0   Living  0     SAB  0   TAB  0   Ectopic  0   Multiple  0   Live Births  0              Patient Active Problem List   Diagnosis Date Noted  . Mass of perirectal soft tissue 10/17/2017  . Right ankle injury, subsequent encounter 09/04/2017  . Plantar fasciitis of right foot 10/03/2016  . History of endometrial cancer 09/17/2016  . Left knee injury 06/27/2015  . Anxiety 09/21/2014  . Right knee pain 11/30/2013  . ABDOMINAL BLOATING 10/12/2010  . ABDOMINAL PAIN OTHER SPECIFIED SITE 09/28/2010  . NAUSEA ALONE 07/04/2010    Past Medical History:  Diagnosis Date  . Cancer ([place])   . Endometrial cancer ([place]) 2010    Past Surgical History:  Procedure Laterality Date  . ABDOMINAL HYSTERECTOMY    . BREAST LUMPECTOMY Right 2007  . BREAST SURGERY     lumpectomy   . HYSTEROSCOPY    . MYOMECTOMY  2008  . OOPHORECTOMY Left 2010  . PELVIC  LAPAROSCOPY      Current Outpatient Medications  Medication Sig Dispense Refill  . LORazepam (ATIVAN) 1 MG tablet Take 1 tablet (1 mg total) by mouth every 8 (eight) hours as needed for anxiety. 10 tablet 0   No current facility-administered medications for this visit.      ALLERGIES: Patient has no known allergies.  Family History  Problem Relation Age of Onset  . Hypertension Mother   . Hypertension Father   . Rheum arthritis Father   . Diabetes Neg Hx   . Heart attack Neg Hx   . Hyperlipidemia Neg Hx   . Sudden death Neg Hx     Social History   Socioeconomic History  . Marital status: Married    Spouse name: Not on file  . Number of children: Not on file  . Years of education: Not on file  . Highest education level: Not on file  Occupational History  . Not on file  Social Needs  . Financial resource strain: Not on file  . Food insecurity:    Worry: Not on file    Inability: Not  on file  . Transportation needs:    Medical: Not on file    Non-medical: Not on file  Tobacco Use  . Smoking status: Never Smoker  . Smokeless tobacco: Never Used  Substance and Sexual Activity  . Alcohol use: Yes    Alcohol/week: 1.8 - 2.4 oz    Types: 3 - 4 Standard drinks or equivalent per week  . Drug use: No  . Sexual activity: Yes    Partners: Male    Birth control/protection: Surgical    Comment: hysterectomy   Lifestyle  . Physical activity:    Days per week: Not on file    Minutes per session: Not on file  . Stress: Not on file  Relationships  . Social connections:    Talks on phone: Not on file    Gets together: Not on file    Attends religious service: Not on file    Active member of club or organization: Not on file    Attends meetings of clubs or organizations: Not on file    Relationship status: Not on file  . Intimate partner violence:    Fear of current or ex partner: Not on file    Emotionally abused: Not on file    Physically abused: Not on file     Forced sexual activity: Not on file  Other Topics Concern  . Not on file  Social History Narrative  . Not on file    Review of Systems  Constitutional: Negative.   HENT: Negative.   Eyes: Negative.   Respiratory: Negative.   Cardiovascular: Negative.   Gastrointestinal: Negative.   Genitourinary: Negative.   Musculoskeletal: Negative.   Skin: Negative.   Neurological: Negative.   Endo/Heme/Allergies: Negative.   Psychiatric/Behavioral: Negative.     PHYSICAL EXAMINATION:    BP 121/70 (BP Location: Right Arm, Patient Position: Sitting, Cuff Size: Normal)   Pulse 72   Resp 14   Wt 185 lb (83.9 kg)   BMI 28.98 kg/m     General appearance: alert, cooperative and appears stated age  ASSESSMENT Left adnexal fibroid, stable/smaller in size since last imaging BLQ abdominal pain, intermittent, she hasn't noticed a cyclic nature to the pain, hasn't noticed a correlation with her bowels. She does c/o constipation then diarrhea, c/w IBS    PLAN No further imaging needed unless she has a change in symptoms Recommended she try and calendar her pain and BM's Discussed option of f/u with GI, she will let me know if she is interested Call with worsening pain   An After Visit Summary was printed and given to the patient.

## 2018-06-18 IMAGING — MR MR PELVIS WO/W CM
5 of 9 series · 25 of 48 positions shown · IV contrast (multihance)
Comparison: Pelvic ultrasound 10/07/2017 from [REDACTED].

CLINICAL DATA: Left adnexal mass on outside pelvic ultrasound.
History of total hysterectomy and left oophorectomy in 0171. History
of endometrial cancer.

EXAM:
MRI PELVIS WITHOUT AND WITH CONTRAST
TECHNIQUE: Multiplanar multisequence MR imaging of the pelvis was performed
both before and after administration of intravenous contrast.
CONTRAST:  17mL MULTIHANCE GADOBENATE DIMEGLUMINE 529 MG/ML IV SOLN

[Series 3: T2 · coronal · 5.0mm · 0.49mm/px · 6 of 27 slices shown]
[im 1/27]
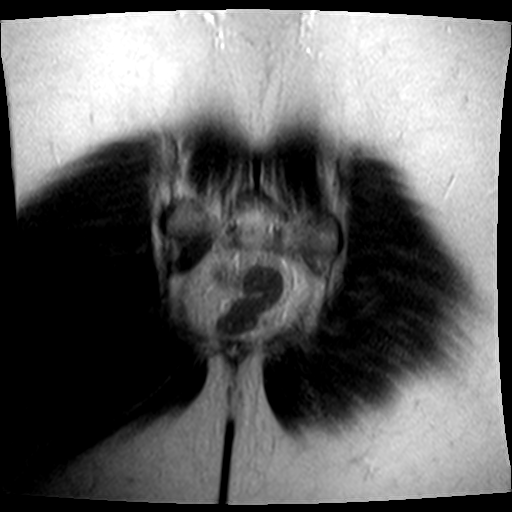
[im 6/27]
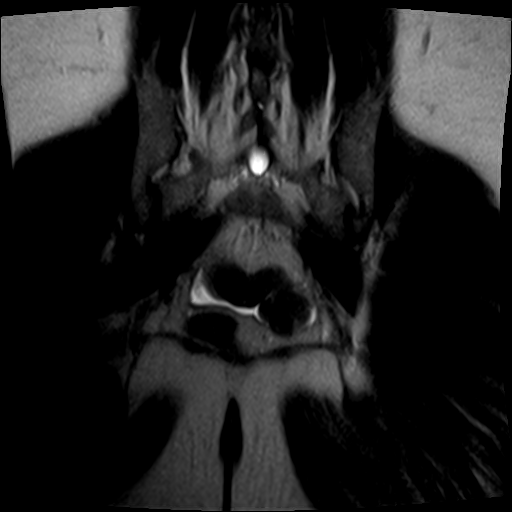
[im 11/27]
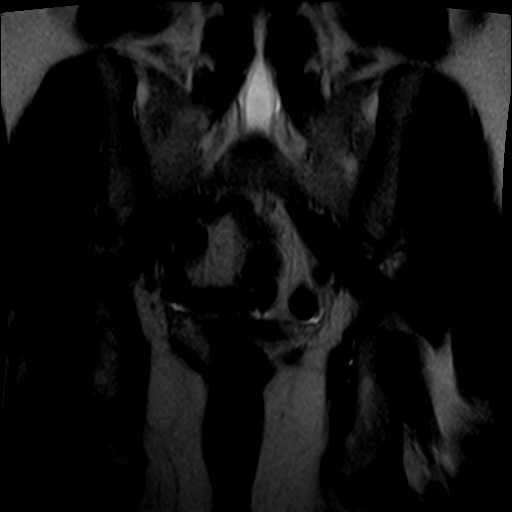
[im 16/27]
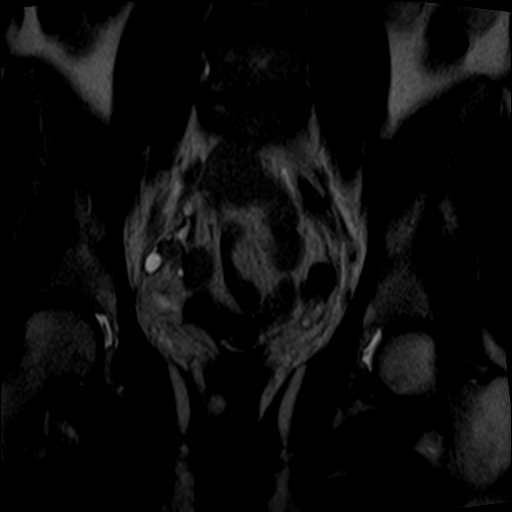
[im 21/27]
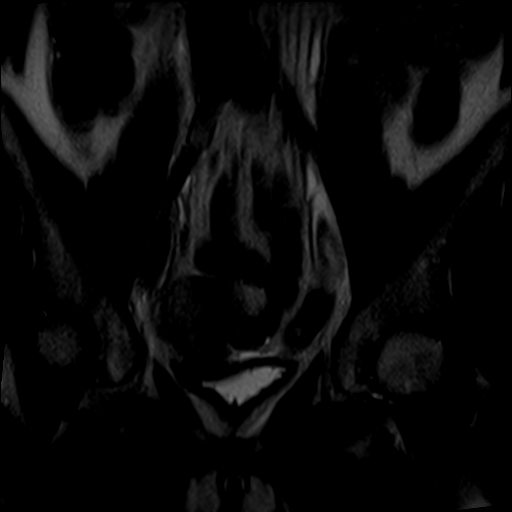
[im 27/27]
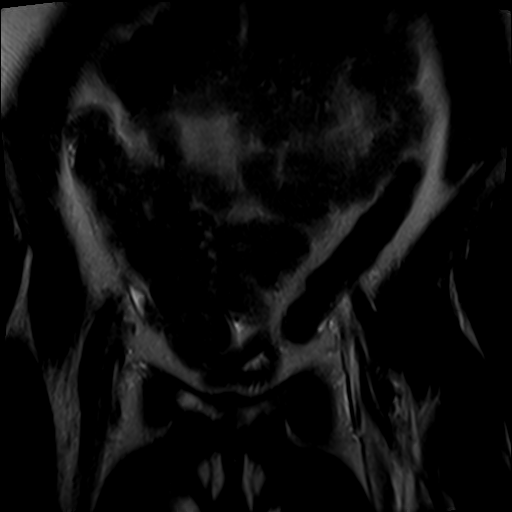

[Series 4: t2_tse_sag · sagittal · 4.0mm · 0.47mm/px · 6 of 25 slices shown]
[im 1/25]
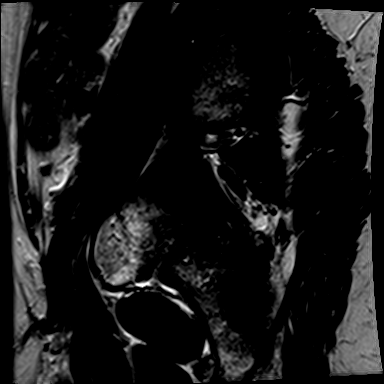
[im 5/25]
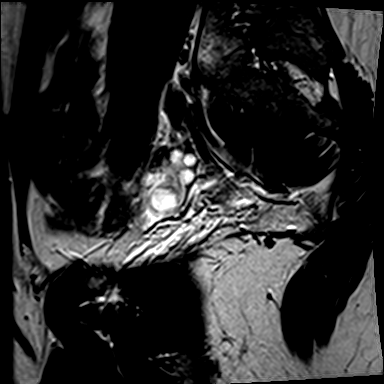
[im 10/25]
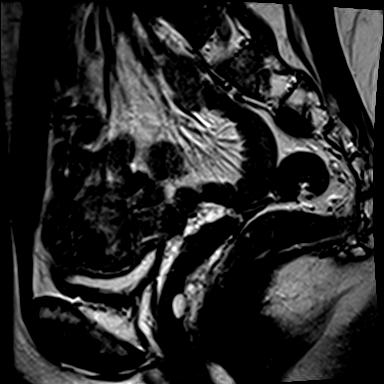
[im 15/25]
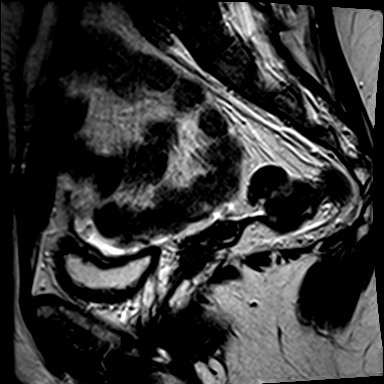
[im 20/25]
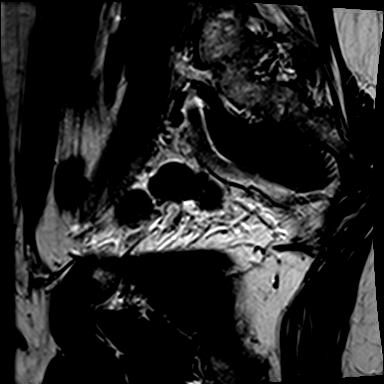
[im 25/25]
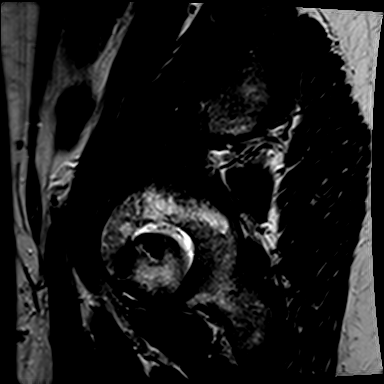

[Series 5: t2_tse axial · axial · 6.0mm · 0.47mm/px · z∈[-76,+50]mm · 5 of 20 slices shown]
[im 1/20]
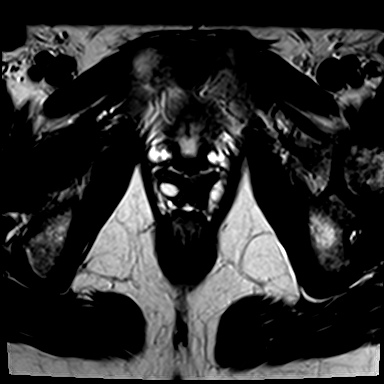
[im 5/20]
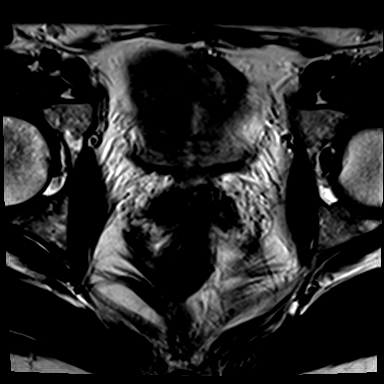
[im 10/20]
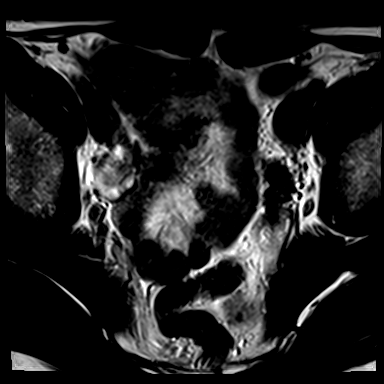
[im 15/20]
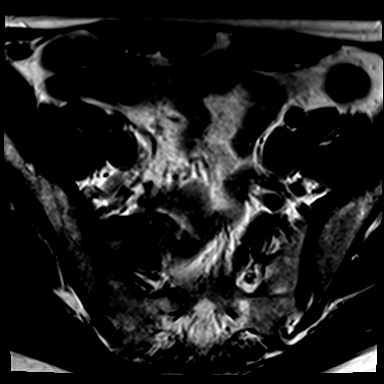
[im 20/20]
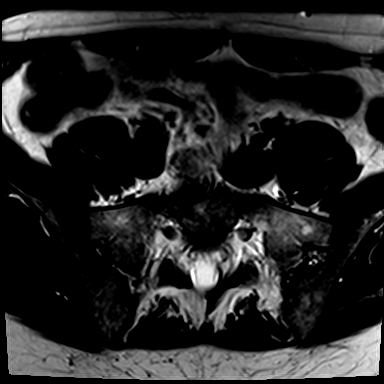

[Series 6: t2_tse axial fs · axial · 6.0mm · 0.47mm/px · z∈[-76,+50]mm · 5 of 20 slices shown]
[im 1/20]
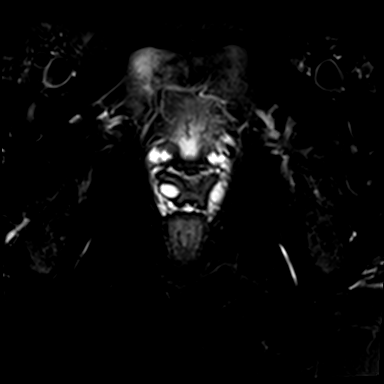
[im 5/20]
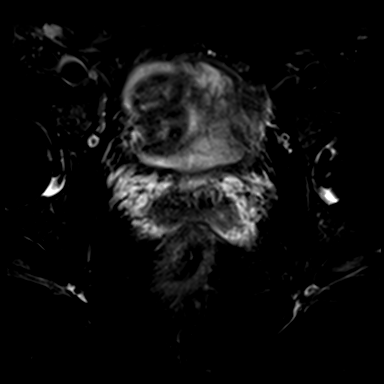
[im 10/20]
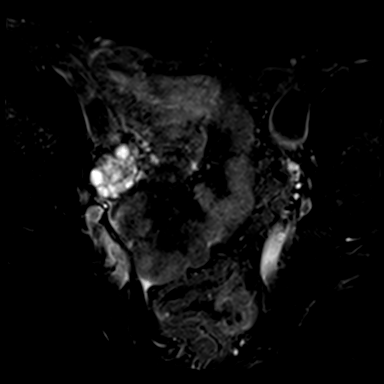
[im 15/20]
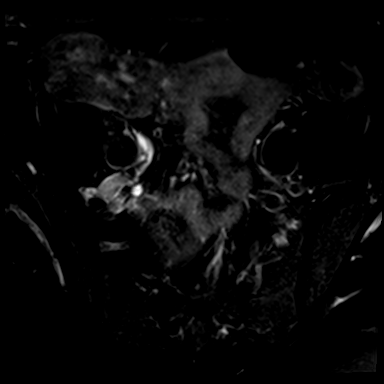
[im 20/20]
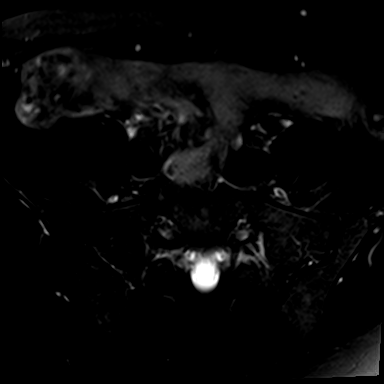

[Series 7: axial spgr · axial · 6.0mm · 0.49mm/px · z∈[-76,-16]mm · 3 of 20 slices shown]
[im 1/20]
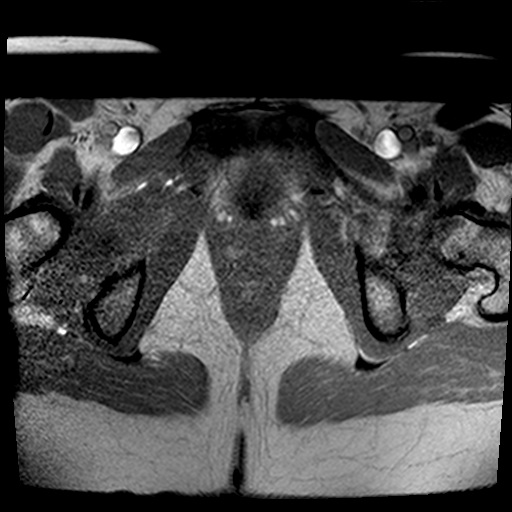
[im 5/20]
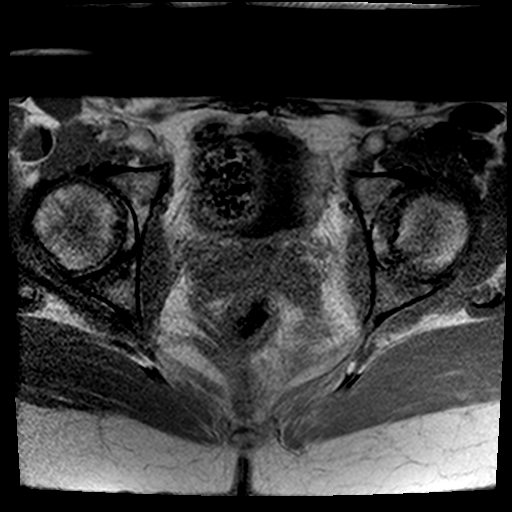
[im 10/20]
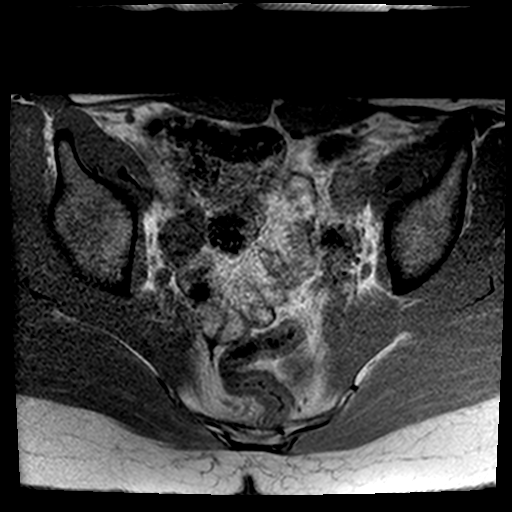

[25 of 48 positions shown; findings below may reference images not displayed]

FINDINGS: Urinary Tract: The visualized distal ureters and bladder appear
unremarkable.

Bowel: No bowel wall thickening, distention or surrounding
inflammation identified within the pelvis.

Vascular/Lymphatic: No enlarged pelvic lymph nodes identified. Small
inguinal lymph nodes. No significant vascular findings.

Reproductive:

Uterus:  Hysterectomy.

Cervix/Vagina: The cervix appears normal. There is a 10 mm cyst
within the posterior right wall of the vagina.

Right ovary: Appears normal, measuring 4.2 x 2.5 x 2.2 cm. There is
a small right ovarian follicle. Right adnexal susceptibility
artifact attributed to previous surgery.

Left ovary: No definite normal ovarian tissue seen in the left
adnexa. Surgical history indicates previous left oophorectomy.

Other: There is a well-circumscribed left perirectal mass which
measures 2.8 x 3.0 x 2.6 cm. This demonstrates mildly heterogeneous,
predominately low T2 signal. T1 signal is homogeneous without
definite fat signal. Following contrast, this lesion demonstrates
homogeneous enhancement. No other pelvic masses are seen. Small
amount of pelvic ascites.

Musculoskeletal: No acute or worrisome osseous findings.
IMPRESSION: 1. Enhancing left perirectal mass appears removed from the left
adnexa (and the patient is reportedly status post hysterectomy and
left oophorectomy). Metastatic disease from the patient's
endometrial cancer is certainly possible. Given the low T2 signal,
benign explanations such as leiomyoma, myofibroma or
gastrointestinal stromal tumor are possible as well. Tissue sampling
recommended. This should be amenable to percutaneous transgluteal
biopsy.
2. No other pelvic masses.
3. Right vaginal cyst.

## 2018-06-27 IMAGING — CT CT BIOPSY
1 of 2 series · 15 of 32 positions shown, 19 images · non-contrast
Comparison: none

INDICATION: 46-year-old female with a history of possible endometrial carcinoma
recurrence of the left pelvis.

[Series 2: i-spiral 5.0 b40f · axial · 0.78mm/px · z∈[+797,+1053]mm · 15 of 81 slices shown, 19 images]
[im 4/81  soft-tissue]
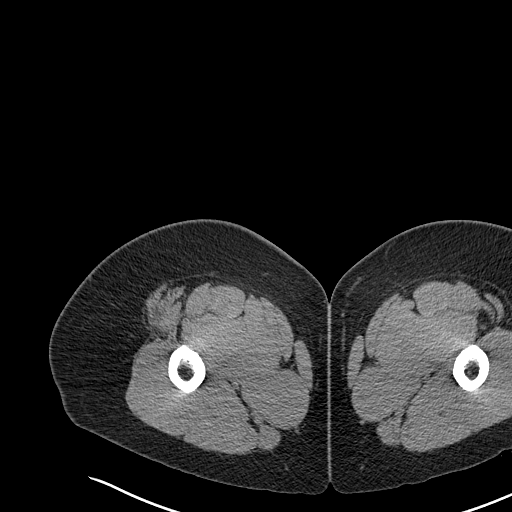
[im 4/81  bone]
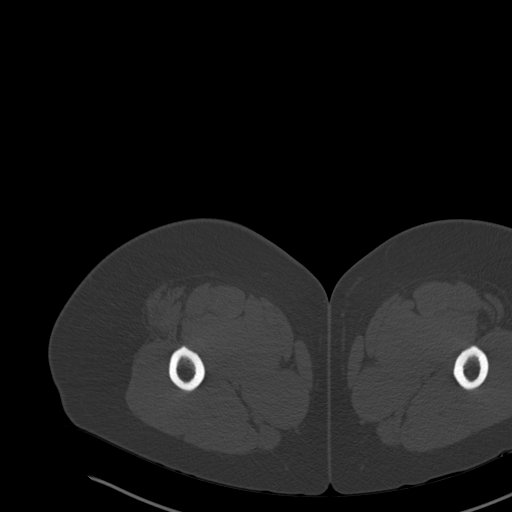
[im 10/81  soft-tissue]
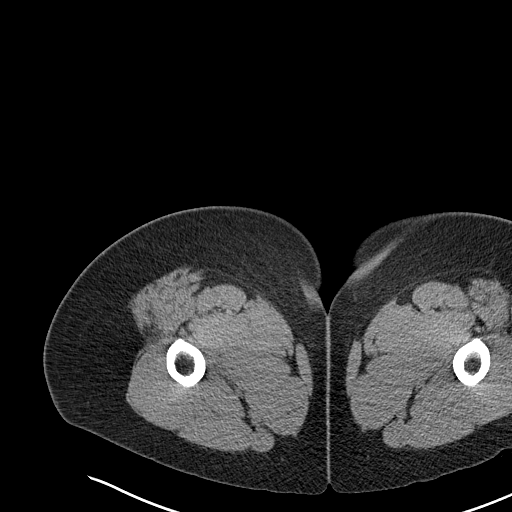
[im 16/81  soft-tissue]
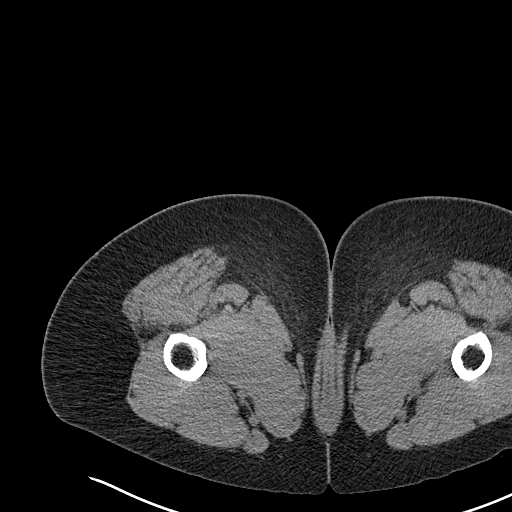
[im 22/81  soft-tissue]
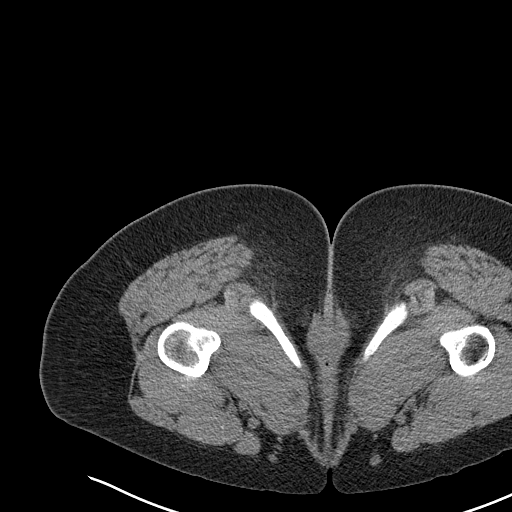
[im 28/81  soft-tissue]
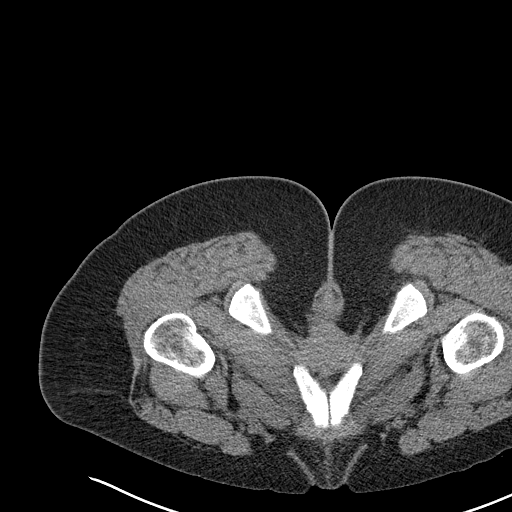
[im 34/81  soft-tissue]
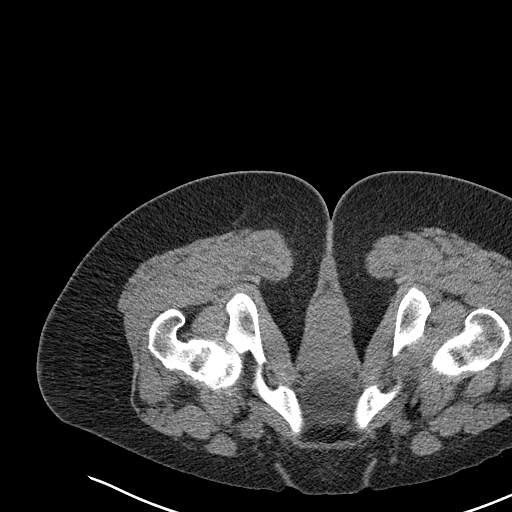
[im 41/81  soft-tissue]
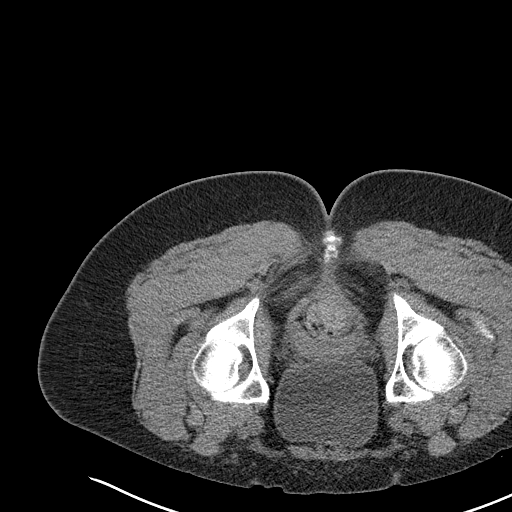
[im 47/81  soft-tissue]
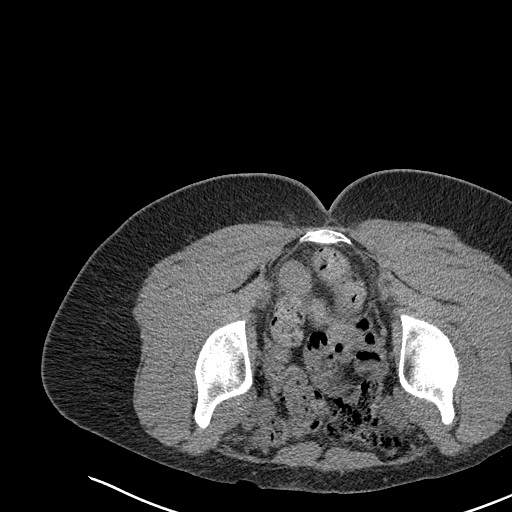
[im 53/81  soft-tissue]
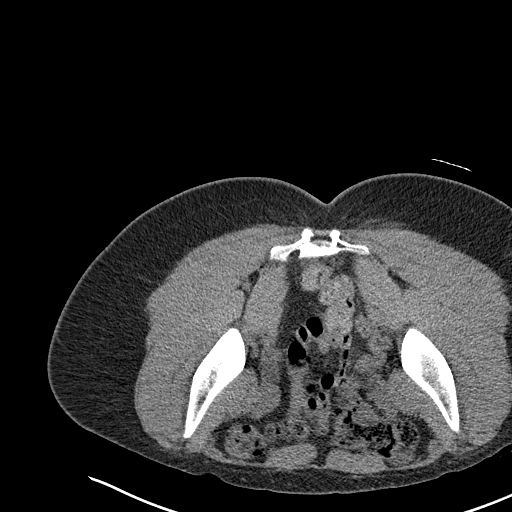
[im 53/81  bone]
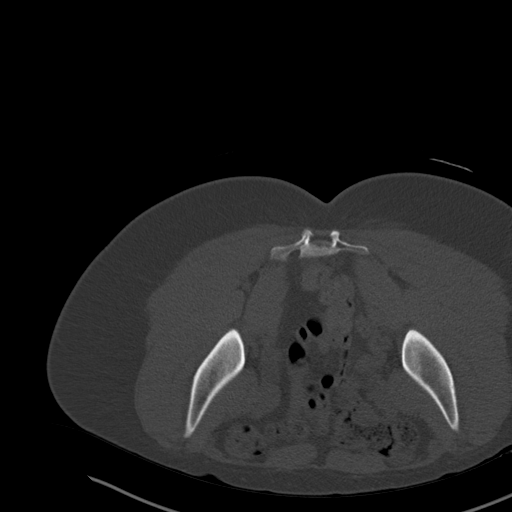
[im 59/81  soft-tissue]
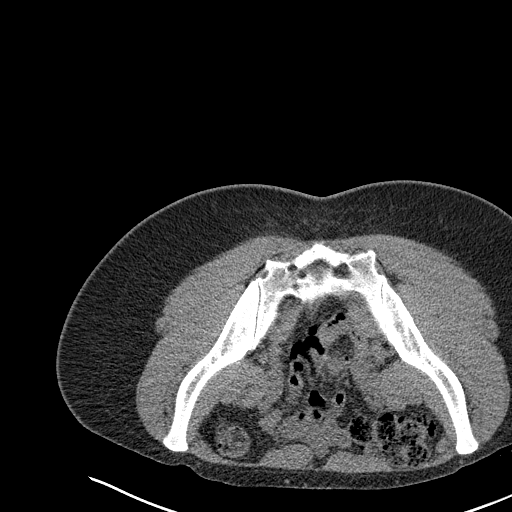
[im 65/81  soft-tissue]
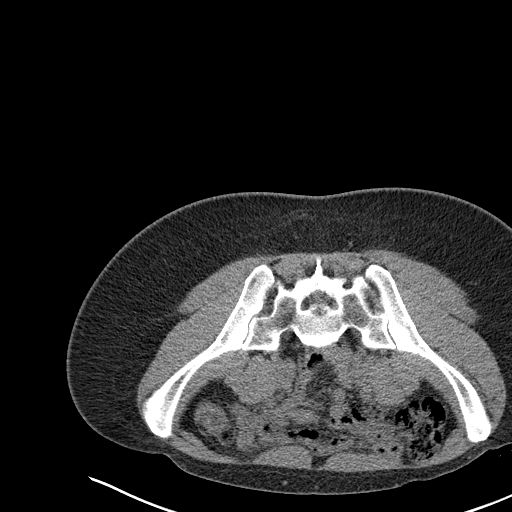
[im 68/81  lung]
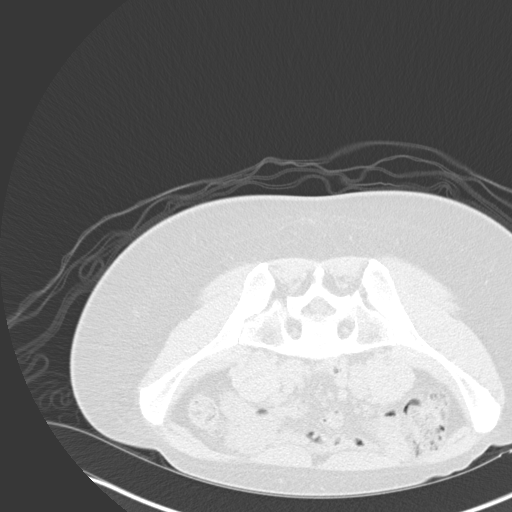
[im 71/81  soft-tissue]
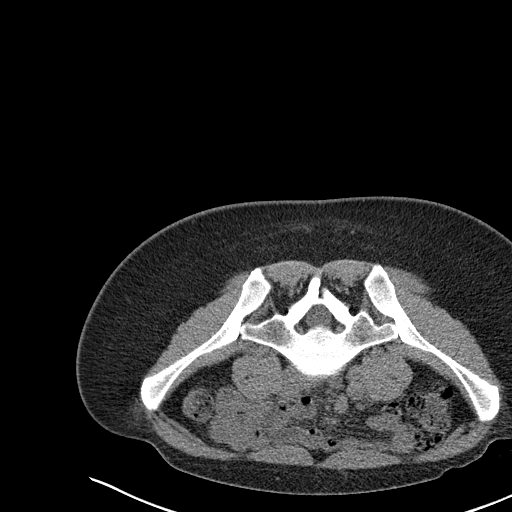
[im 71/81  lung]
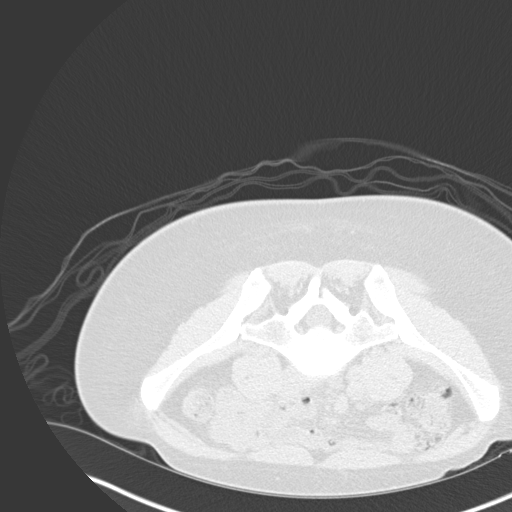
[im 74/81  lung]
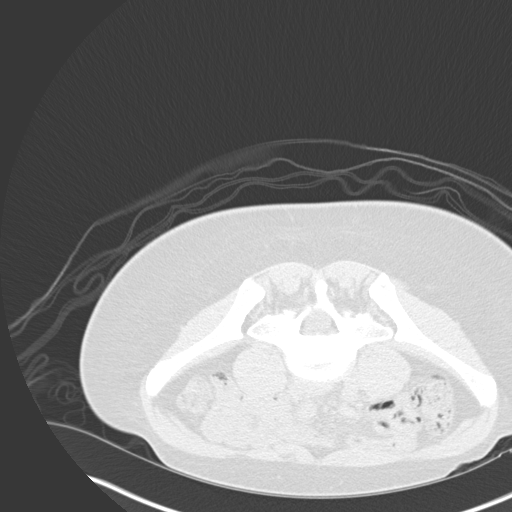
[im 77/81  soft-tissue]
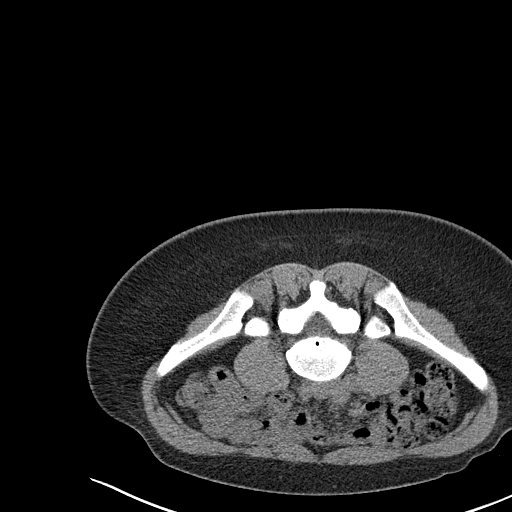
[im 77/81  lung]
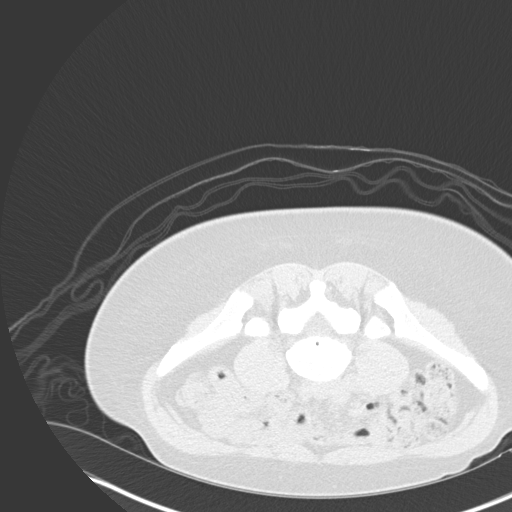

[15 of 32 positions shown; findings below may reference images not displayed]

EXAM:
CT BIOPSY

MEDICATIONS:
None.

ANESTHESIA/SEDATION:
Moderate (conscious) sedation was employed during this procedure. A
total of Versed 3.0 mg and Fentanyl 150 mcg was administered
intravenously.

Moderate Sedation Time: 15 minutes. The patient's level of
consciousness and vital signs were monitored continuously by
radiology nursing throughout the procedure under my direct
supervision.

FLUOROSCOPY TIME:  CT

COMPLICATIONS:
None

PROCEDURE:
Informed written consent was obtained from the patient after a
thorough discussion of the procedural risks, benefits and
alternatives. All questions were addressed. Maximal Sterile Barrier
Technique was utilized including caps, mask, sterile gowns, sterile
gloves, sterile drape, hand hygiene and skin antiseptic. A timeout
was performed prior to the initiation of the procedure.

Patient position prone position on the CT gantry table. Scout CT of
the pelvis performed for planning purposes.

Once the patient is prepped and draped in the usual sterile fashion,
1% lidocaine was used for local anesthesia.

Using CT guidance, 17 gauge guide needle was advanced into the soft
tissue nodule of the left pelvis.

Multiple 18 gauge core biopsy were acquired. Patient tolerated the
procedure well and remained hemodynamically stable throughout.

No complications were encountered and no significant blood loss.
IMPRESSION: Status post CT-guided biopsy of left pelvic soft tissue nodule.
Tissue specimen sent to pathology for complete histopathologic
analysis.

## 2018-07-01 DIAGNOSIS — M25571 Pain in right ankle and joints of right foot: Secondary | ICD-10-CM | POA: Diagnosis not present

## 2018-07-06 DIAGNOSIS — M25571 Pain in right ankle and joints of right foot: Secondary | ICD-10-CM | POA: Diagnosis not present

## 2018-07-10 DIAGNOSIS — M25571 Pain in right ankle and joints of right foot: Secondary | ICD-10-CM | POA: Diagnosis not present

## 2018-07-22 DIAGNOSIS — M25571 Pain in right ankle and joints of right foot: Secondary | ICD-10-CM | POA: Diagnosis not present

## 2018-08-05 DIAGNOSIS — M25571 Pain in right ankle and joints of right foot: Secondary | ICD-10-CM | POA: Diagnosis not present

## 2018-09-09 DIAGNOSIS — M25571 Pain in right ankle and joints of right foot: Secondary | ICD-10-CM | POA: Diagnosis not present

## 2018-10-27 NOTE — Progress Notes (Signed)
48 y.o. G0P0000 Married White or Caucasian Not Hispanic or Latino female here for annual exam.   She has a h/o adenocarcinoma of the endometrium and is s/p TLH/LSO in 5/10. She had an ultrasound in 12/18 for pain and was noted to have a solid mass in the left adnexa with vascular flow. Biopsy of the lesiorn returned as a fibroid. She had a consultation with Dr Denman George who recommended a f/u ultrasound. The f/u ultrasound was stable and no further evaluation was recommended unless she became symptomatic.  She has noticed some increase in her abdominal pain in the last several months.  She goes from constipation to diarrhea. The pain seems associated with BM. She also gets pelvic cramping.  She can go 2-3 days without a BM, then can have an hour of ongoing diarrhea.  She has had a 15 lb weight gain in the last year. She is working out more, eating better, not helping.     No LMP recorded. Patient has had a hysterectomy.          Sexually active: Yes.    The current method of family planning is status post hysterectomy.    Exercising: Yes.    spin, weight training, running Smoker:  no  Health Maintenance: Pap:  2010  History of abnormal Pap:  no MMG:  11/2017 [facility]NL at [facility] per patient, will call for report Colonoscopy:  ~2012 WNL per patient BMD:   Never TDaP:  09/26/2017 Gardasil: Never   reports that she has never smoked. She has never used smokeless tobacco. She reports current alcohol use of about 3.0 - 4.0 standard drinks of alcohol per week. She reports that she does not use drugs. 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   Past Medical History:  Diagnosis Date  . Cancer ([place])   . Endometrial cancer ([place]) 2010    Past Surgical History:  Procedure Laterality Date  . ABDOMINAL HYSTERECTOMY    . BREAST LUMPECTOMY Right 2007  . BREAST SURGERY     lumpectomy   . HYSTEROSCOPY    . MYOMECTOMY  2008  . OOPHORECTOMY Left 2010  . PELVIC LAPAROSCOPY      Current  Outpatient Medications  Medication Sig Dispense Refill  . LORazepam (ATIVAN) 1 MG tablet Take 1 tablet (1 mg total) by mouth every 8 (eight) hours as needed for anxiety. 10 tablet 0   No current facility-administered medications for this visit.     Family History  Problem Relation Age of Onset  . Hypertension Mother   . Hypertension Father   . Rheum arthritis Father   . Diabetes Neg Hx   . Heart attack Neg Hx   . Hyperlipidemia Neg Hx   . Sudden death Neg Hx     Review of Systems  Constitutional: Negative.   HENT: Negative.   Eyes: Negative.   Respiratory: Negative.   Cardiovascular: Negative.   Gastrointestinal: Positive for constipation and diarrhea.  Endocrine: Negative.   Genitourinary: Positive for pelvic pain.  Musculoskeletal: Negative.   Skin: Negative.   Allergic/Immunologic: Negative.   Neurological: Negative.   Hematological: Negative.   Psychiatric/Behavioral: Negative.     Exam:   BP 124/82 (BP Location: Right Arm, Patient Position: Sitting, Cuff Size: Normal)   Pulse 64   Ht 5\' 7"  (1.702 m)   Wt 201 lb 6.4 oz (91.4 kg)   BMI 31.54 kg/m   Weight change: @WEIGHTCHANGE @ Height:   Height: 5\' 7"  (170.2 cm)  Ht Readings from Last 3 Encounters:  10/29/18 5\' 7"  (1.702 m)  10/22/17 5\' 7"  (1.702 m)  10/20/17 5\' 7"  (1.702 m)    General appearance: alert, cooperative and appears stated age Head: Normocephalic, without obvious abnormality, atraumatic Neck: no adenopathy, supple, symmetrical, trachea midline and thyroid normal to inspection and palpation Lungs: clear to auscultation bilaterally Cardiovascular: regular rate and rhythm Breasts: normal appearance, no masses or tenderness, evidence of biopsy on the right Abdomen: soft, non-tender; non distended,  no masses,  no organomegaly Extremities: extremities normal, atraumatic, no cyanosis or edema Skin: Skin color, texture, turgor normal. No rashes or lesions Lymph nodes: Cervical, supraclavicular, and  axillary nodes normal. No abnormal inguinal nodes palpated Neurologic: Grossly normal   Pelvic: External genitalia:  no lesions              Urethra:  normal appearing urethra with no masses, tenderness or lesions              Bartholins and Skenes: normal                 Vagina: normal appearing vagina with normal color and discharge, no lesions              Cervix: absent               Bimanual Exam:  Uterus:  uterus absent              Adnexa: no mass, fullness, tenderness               Rectovaginal: Confirms               Anus:  normal sphincter tone, no lesions  Chaperone was present for exam.  A:  Well Woman with normal exam  H/O hysterectomy for uterine cancer  H/O pelvic fibroid, increase in pain  Diarrhea/consitpation/pain  Weight gain  BMI 31  P:   No pap needed  Screening labs, TSH, HgbA1C  Return for pelvic ultrasound  Referral to GI  Discussed breast self exam  Discussed calcium and vit D intake

## 2018-10-29 ENCOUNTER — Ambulatory Visit (INDEPENDENT_AMBULATORY_CARE_PROVIDER_SITE_OTHER): Payer: 59 | Admitting: Obstetrics and Gynecology

## 2018-10-29 ENCOUNTER — Other Ambulatory Visit: Payer: Self-pay

## 2018-10-29 ENCOUNTER — Encounter: Payer: Self-pay | Admitting: Obstetrics and Gynecology

## 2018-10-29 VITALS — BP 124/82 | HR 64 | Ht 67.0 in | Wt 201.4 lb

## 2018-10-29 DIAGNOSIS — R109 Unspecified abdominal pain: Secondary | ICD-10-CM | POA: Diagnosis not present

## 2018-10-29 DIAGNOSIS — R198 Other specified symptoms and signs involving the digestive system and abdomen: Secondary | ICD-10-CM

## 2018-10-29 DIAGNOSIS — R635 Abnormal weight gain: Secondary | ICD-10-CM

## 2018-10-29 DIAGNOSIS — Z6831 Body mass index (BMI) 31.0-31.9, adult: Secondary | ICD-10-CM

## 2018-10-29 DIAGNOSIS — R1900 Intra-abdominal and pelvic swelling, mass and lump, unspecified site: Secondary | ICD-10-CM

## 2018-10-29 DIAGNOSIS — R102 Pelvic and perineal pain: Secondary | ICD-10-CM

## 2018-10-29 DIAGNOSIS — Z01419 Encounter for gynecological examination (general) (routine) without abnormal findings: Secondary | ICD-10-CM | POA: Diagnosis not present

## 2018-10-29 DIAGNOSIS — Z0000 Encounter for general adult medical examination without abnormal findings: Secondary | ICD-10-CM

## 2018-10-29 NOTE — Patient Instructions (Signed)
EXERCISE AND DIET:  We recommended that you start or continue a regular exercise program for good health. Regular exercise means any activity that makes your heart beat faster and makes you sweat.  We recommend exercising at least 30 minutes per day at least 3 days a week, preferably 4 or 5.  We also recommend a diet low in fat and sugar.  Inactivity, poor dietary choices and obesity can cause diabetes, heart attack, stroke, and kidney damage, among others.    ALCOHOL AND SMOKING:  Women should limit their alcohol intake to no more than 7 drinks/beers/glasses of wine (combined, not each!) per week. Moderation of alcohol intake to this level decreases your risk of breast cancer and liver damage. And of course, no recreational drugs are part of a healthy lifestyle.  And absolutely no smoking or even second hand smoke. Most people know smoking can cause heart and lung diseases, but did you know it also contributes to weakening of your bones? Aging of your skin?  Yellowing of your teeth and nails?  CALCIUM AND VITAMIN D:  Adequate intake of calcium and Vitamin D are recommended.  The recommendations for exact amounts of these supplements seem to change often, but generally speaking 1,000 mg of calcium (between diet and supplement) and 800 units of Vitamin D per day seems prudent. Certain women may benefit from higher intake of Vitamin D.  If you are among these women, your doctor will have told you during your visit.    PAP SMEARS:  Pap smears, to check for cervical cancer or precancers,  have traditionally been done yearly, although recent scientific advances have shown that most women can have pap smears less often.  However, every woman still should have a physical exam from her gynecologist every year. It will include a breast check, inspection of the vulva and vagina to check for abnormal growths or skin changes, a visual exam of the cervix, and then an exam to evaluate the size and shape of the uterus and  ovaries.  And after 46 years of age, a rectal exam is indicated to check for rectal cancers. We will also provide age appropriate advice regarding health maintenance, like when you should have certain vaccines, screening for sexually transmitted diseases, bone density testing, colonoscopy, mammograms, etc.   MAMMOGRAMS:  All women over 40 years old should have a yearly mammogram. Many facilities now offer a "3D" mammogram, which may cost around $50 extra out of pocket. If possible,  we recommend you accept the option to have the 3D mammogram performed.  It both reduces the number of women who will be called back for extra views which then turn out to be normal, and it is better than the routine mammogram at detecting truly abnormal areas.    COLON CANCER SCREENING: Now recommend starting at age 45. At this time colonoscopy is not covered for routine screening until 50. There are take home tests that can be done between 45-49.   COLONOSCOPY:  Colonoscopy to screen for colon cancer is recommended for all women at age 50.  We know, you hate the idea of the prep.  We agree, BUT, having colon cancer and not knowing it is worse!!  Colon cancer so often starts as a polyp that can be seen and removed at colonscopy, which can quite literally save your life!  And if your first colonoscopy is normal and you have no family history of colon cancer, most women don't have to have it again for   10 years.  Once every ten years, you can do something that may end up saving your life, right?  We will be happy to help you get it scheduled when you are ready.  Be sure to check your insurance coverage so you understand how much it will cost.  It may be covered as a preventative service at no cost, but you should check your particular policy.      Breast Self-Awareness Breast self-awareness means being familiar with how your breasts look and feel. It involves checking your breasts regularly and reporting any changes to your  health care provider. Practicing breast self-awareness is important. A change in your breasts can be a sign of a serious medical problem. Being familiar with how your breasts look and feel allows you to find any problems early, when treatment is more likely to be successful. All women should practice breast self-awareness, including women who have had breast implants. How to do a breast self-exam One way to learn what is normal for your breasts and whether your breasts are changing is to do a breast self-exam. To do a breast self-exam: Look for Changes  1. Remove all the clothing above your waist. 2. Stand in front of a mirror in a room with good lighting. 3. Put your hands on your hips. 4. Push your hands firmly downward. 5. Compare your breasts in the mirror. Look for differences between them (asymmetry), such as: ? Differences in shape. ? Differences in size. ? Puckers, dips, and bumps in one breast and not the other. 6. Look at each breast for changes in your skin, such as: ? Redness. ? Scaly areas. 7. Look for changes in your nipples, such as: ? Discharge. ? Bleeding. ? Dimpling. ? Redness. ? A change in position. Feel for Changes Carefully feel your breasts for lumps and changes. It is best to do this while lying on your back on the floor and again while sitting or standing in the shower or tub with soapy water on your skin. Feel each breast in the following way:  Place the arm on the side of the breast you are examining above your head.  Feel your breast with the other hand.  Start in the nipple area and make  inch (2 cm) overlapping circles to feel your breast. Use the pads of your three middle fingers to do this. Apply light pressure, then medium pressure, then firm pressure. The light pressure will allow you to feel the tissue closest to the skin. The medium pressure will allow you to feel the tissue that is a little deeper. The firm pressure will allow you to feel the tissue  close to the ribs.  Continue the overlapping circles, moving downward over the breast until you feel your ribs below your breast.  Move one finger-width toward the center of the body. Continue to use the  inch (2 cm) overlapping circles to feel your breast as you move slowly up toward your collarbone.  Continue the up and down exam using all three pressures until you reach your armpit.  Write Down What You Find  Write down what is normal for each breast and any changes that you find. Keep a written record with breast changes or normal findings for each breast. By writing this information down, you do not need to depend only on memory for size, tenderness, or location. Write down where you are in your menstrual cycle, if you are still menstruating. If you are having trouble noticing differences   in your breasts, do not get discouraged. With time you will become more familiar with the variations in your breasts and more comfortable with the exam. How often should I examine my breasts? Examine your breasts every month. If you are breastfeeding, the best time to examine your breasts is after a feeding or after using a breast pump. If you menstruate, the best time to examine your breasts is 5-7 days after your period is over. During your period, your breasts are lumpier, and it may be more difficult to notice changes. When should I see my health care provider? See your health care provider if you notice:  A change in shape or size of your breasts or nipples.  A change in the skin of your breast or nipples, such as a reddened or scaly area.  Unusual discharge from your nipples.  A lump or thick area that was not there before.  Pain in your breasts.  Anything that concerns you.  

## 2018-10-30 LAB — HEMOGLOBIN A1C
Est. average glucose Bld gHb Est-mCnc: 103 mg/dL
Hgb A1c MFr Bld: 5.2 % (ref 4.8–5.6)

## 2018-10-30 LAB — CBC
HEMATOCRIT: 36.9 % (ref 34.0–46.6)
Hemoglobin: 12.8 g/dL (ref 11.1–15.9)
MCH: 31.6 pg (ref 26.6–33.0)
MCHC: 34.7 g/dL (ref 31.5–35.7)
MCV: 91 fL (ref 79–97)
PLATELETS: 244 10*3/uL (ref 150–450)
RBC: 4.05 x10E6/uL (ref 3.77–5.28)
RDW: 12 % (ref 11.7–15.4)
WBC: 4.7 10*3/uL (ref 3.4–10.8)

## 2018-10-30 LAB — COMPREHENSIVE METABOLIC PANEL
ALT: 15 IU/L (ref 0–32)
AST: 14 IU/L (ref 0–40)
Albumin/Globulin Ratio: 2 (ref 1.2–2.2)
Albumin: 4.3 g/dL (ref 3.5–5.5)
Alkaline Phosphatase: 25 IU/L — ABNORMAL LOW (ref 39–117)
BUN/Creatinine Ratio: 21 (ref 9–23)
BUN: 18 mg/dL (ref 6–24)
Bilirubin Total: 0.5 mg/dL (ref 0.0–1.2)
CO2: 23 mmol/L (ref 20–29)
Calcium: 9.2 mg/dL (ref 8.7–10.2)
Chloride: 103 mmol/L (ref 96–106)
Creatinine, Ser: 0.85 mg/dL (ref 0.57–1.00)
GFR, EST AFRICAN AMERICAN: 96 mL/min/{1.73_m2} (ref >59)
GFR, EST NON AFRICAN AMERICAN: 83 mL/min/{1.73_m2} (ref >59)
Globulin, Total: 2.1 g/dL (ref 1.5–4.5)
Glucose: 97 mg/dL (ref 65–99)
Potassium: 4.7 mmol/L (ref 3.5–5.2)
Sodium: 139 mmol/L (ref 134–144)
Total Protein: 6.4 g/dL (ref 6.0–8.5)

## 2018-10-30 LAB — TSH: TSH: 2.99 u[IU]/mL (ref 0.450–4.500)

## 2018-11-03 ENCOUNTER — Telehealth: Payer: Self-pay | Admitting: Obstetrics and Gynecology

## 2018-11-03 NOTE — Telephone Encounter (Signed)
Patient returned call. Reviewed appointment dates to reschedule ultrasound. Patient will be out of town the week of 11/09/2018 and requested to schedule the following week. Patient is rescheduled on 11/17/2018 with Dr Talbert Nan. Patient is aware of the appointment date, arrival time and cancellation policy. No further questions. Will close encounter   cc: Lamont Snowball, RN

## 2018-11-03 NOTE — Telephone Encounter (Signed)
Call placed to patient to advise her appointment on 11/05/2018 will need to be re-scheduled. Patient advised she is currently in a meeting and she will need to call back to re-schedule

## 2018-11-05 ENCOUNTER — Other Ambulatory Visit: Payer: 59

## 2018-11-05 ENCOUNTER — Other Ambulatory Visit: Payer: 59 | Admitting: Obstetrics and Gynecology

## 2018-11-17 ENCOUNTER — Other Ambulatory Visit: Payer: Self-pay | Admitting: Obstetrics and Gynecology

## 2018-11-17 ENCOUNTER — Encounter: Payer: Self-pay | Admitting: Obstetrics and Gynecology

## 2018-11-17 ENCOUNTER — Ambulatory Visit (INDEPENDENT_AMBULATORY_CARE_PROVIDER_SITE_OTHER): Payer: 59 | Admitting: Obstetrics and Gynecology

## 2018-11-17 ENCOUNTER — Ambulatory Visit (INDEPENDENT_AMBULATORY_CARE_PROVIDER_SITE_OTHER): Payer: 59

## 2018-11-17 VITALS — BP 104/60 | HR 68 | Resp 16 | Ht 67.0 in | Wt 201.0 lb

## 2018-11-17 DIAGNOSIS — R1900 Intra-abdominal and pelvic swelling, mass and lump, unspecified site: Secondary | ICD-10-CM

## 2018-11-17 DIAGNOSIS — D219 Benign neoplasm of connective and other soft tissue, unspecified: Secondary | ICD-10-CM | POA: Diagnosis not present

## 2018-11-17 DIAGNOSIS — R198 Other specified symptoms and signs involving the digestive system and abdomen: Secondary | ICD-10-CM | POA: Diagnosis not present

## 2018-11-17 DIAGNOSIS — R109 Unspecified abdominal pain: Secondary | ICD-10-CM

## 2018-11-17 DIAGNOSIS — R102 Pelvic and perineal pain: Secondary | ICD-10-CM

## 2018-11-17 DIAGNOSIS — R1030 Lower abdominal pain, unspecified: Secondary | ICD-10-CM | POA: Diagnosis not present

## 2018-11-17 NOTE — Progress Notes (Signed)
GYNECOLOGY  VISIT   HPI: 48 y.o.   Married White or Caucasian Not Hispanic or Latino  female   G0P0000 with No LMP recorded. Patient has had a hysterectomy.   here for consult following PUS.   The paitent has a h/o adenocarcinoma of the endometrium and is s/p TLH/LSO in 5/10.   She had an ultrasound in 12/18 for pain and was noted to have a solid mass in her left adnexa with vascular flow. MRI was done and tissue biopsy was recommended. She underwent a CT guided biopsy of the lesion which returned as a fibroid. She has had a consultation with Dr Denman George At the time of her annual exam earlier this month, she c/o increase in abdominal/pelvic pain. She is also having issues with diarrhea and constipation and has a referral in to see Dr Carlean Purl. Pain is unchanged, overall tolerable.    GYNECOLOGIC HISTORY: No LMP recorded. Patient has had a hysterectomy. Contraception:Hysterectomy Menopausal hormone therapy: none        OB History    Gravida  0   Para  0   Term  0   Preterm  0   AB  0   Living  0     SAB  0   TAB  0   Ectopic  0   Multiple  0   Live Births  0              Patient Active Problem List   Diagnosis Date Noted  . Mass of perirectal soft tissue 10/17/2017  . Right ankle injury, subsequent encounter 09/04/2017  . Plantar fasciitis of right foot 10/03/2016  . History of endometrial cancer 09/17/2016  . Left knee injury 06/27/2015  . Anxiety 09/21/2014  . Right knee pain 11/30/2013  . ABDOMINAL BLOATING 10/12/2010  . ABDOMINAL PAIN OTHER SPECIFIED SITE 09/28/2010  . NAUSEA ALONE 07/04/2010    Past Medical History:  Diagnosis Date  . Cancer ([place])   . Endometrial cancer ([place]) 2010    Past Surgical History:  Procedure Laterality Date  . ABDOMINAL HYSTERECTOMY    . BREAST LUMPECTOMY Right 2007  . BREAST SURGERY     lumpectomy   . HYSTEROSCOPY    . MYOMECTOMY  2008  . OOPHORECTOMY Left 2010  . PELVIC LAPAROSCOPY      Current Outpatient  Medications  Medication Sig Dispense Refill  . LORazepam (ATIVAN) 1 MG tablet Take 1 tablet (1 mg total) by mouth every 8 (eight) hours as needed for anxiety. 10 tablet 0   No current facility-administered medications for this visit.      ALLERGIES: Patient has no known allergies.  Family History  Problem Relation Age of Onset  . Hypertension Mother   . Hypertension Father   . Rheum arthritis Father   . Diabetes Neg Hx   . Heart attack Neg Hx   . Hyperlipidemia Neg Hx   . Sudden death Neg Hx     Social History   Socioeconomic History  . Marital status: Married    Spouse name: Not on file  . Number of children: Not on file  . Years of education: Not on file  . Highest education level: Not on file  Occupational History  . Not on file  Social Needs  . Financial resource strain: Not on file  . Food insecurity:    Worry: Not on file    Inability: Not on file  . Transportation needs:    Medical: Not on file  Non-medical: Not on file  Tobacco Use  . Smoking status: Never Smoker  . Smokeless tobacco: Never Used  Substance and Sexual Activity  . Alcohol use: Yes    Alcohol/week: 3.0 - 4.0 standard drinks    Types: 3 - 4 Standard drinks or equivalent per week  . Drug use: No  . Sexual activity: Yes    Partners: Male    Birth control/protection: Surgical    Comment: hysterectomy   Lifestyle  . Physical activity:    Days per week: Not on file    Minutes per session: Not on file  . Stress: Not on file  Relationships  . Social connections:    Talks on phone: Not on file    Gets together: Not on file    Attends religious service: Not on file    Active member of club or organization: Not on file    Attends meetings of clubs or organizations: Not on file    Relationship status: Not on file  . Intimate partner violence:    Fear of current or ex partner: Not on file    Emotionally abused: Not on file    Physically abused: Not on file    Forced sexual activity: Not on  file  Other Topics Concern  . Not on file  Social History Narrative  . Not on file    Review of Systems  Constitutional: Negative.   HENT: Negative.   Eyes: Negative.   Respiratory: Negative.   Cardiovascular: Negative.   Gastrointestinal: Negative.   Genitourinary: Negative.   Musculoskeletal: Negative.   Skin: Negative.   Neurological: Negative.   Endo/Heme/Allergies: Negative.   Psychiatric/Behavioral: Negative.     PHYSICAL EXAMINATION:    There were no vitals taken for this visit.    General appearance: alert, cooperative and appears stated age  ASSESSMENT Left adnexal myoma, slightly smaller in size GI issues    PLAN Patient reassured Referral to GI is pending    An After Visit Summary was printed and given to the patient.

## 2018-12-02 ENCOUNTER — Encounter: Payer: Self-pay | Admitting: Obstetrics and Gynecology

## 2018-12-02 DIAGNOSIS — Z1231 Encounter for screening mammogram for malignant neoplasm of breast: Secondary | ICD-10-CM | POA: Diagnosis not present

## 2018-12-04 ENCOUNTER — Encounter: Payer: Self-pay | Admitting: Gastroenterology

## 2018-12-04 ENCOUNTER — Ambulatory Visit (INDEPENDENT_AMBULATORY_CARE_PROVIDER_SITE_OTHER): Payer: 59 | Admitting: Gastroenterology

## 2018-12-04 VITALS — BP 100/70 | HR 72 | Ht 67.0 in | Wt 204.5 lb

## 2018-12-04 DIAGNOSIS — R197 Diarrhea, unspecified: Secondary | ICD-10-CM

## 2018-12-04 DIAGNOSIS — K5900 Constipation, unspecified: Secondary | ICD-10-CM | POA: Diagnosis not present

## 2018-12-04 MED ORDER — PEG 3350-KCL-NA BICARB-NACL 420 G PO SOLR: 4000.0000 mL | ORAL | 0 refills | Status: DC

## 2018-12-04 NOTE — Patient Instructions (Addendum)
You will be set up for a colonoscopy (for alternating constipation, diarrhea). Please start taking citrucel (orange flavored) powder fiber supplement.  This may cause some bloating at first but that usually goes away. Begin with a small spoonful and work your way up to a large, heaping spoonful daily over a week.  Thank you for entrusting me with your care and choosing [facility].  Dr Ardis Hughs

## 2018-12-04 NOTE — Progress Notes (Signed)
Review of pertinent gastrointestinal problems: 1. Nausea, ? due to gastritis that was noted on October 2011.  EGD H. pylori biopsies were negative. Previously was treated for H. pylori this was based on serologic evidence. Abdominal ultrasound October 2011 normal.  Trial of Carafate made her symptoms worse.  Nexium was not helpful.  09/2010 HIDA scan showed GB EF 49% (normal).  GES 10/2010 ws normal.   HPI: [name] who was referred to me by Joanna Dom, MD  to evaluate alternating constipation, diarrhea.    Chief complaint is alternating constipation, diarrhea  She has never really been regular with her bowels even when she was much younger.  For the past several years she has been bothered by significant alternating constipation and diarrhea.  She will go up to 3 days without having a bowel movement getting increasingly uncomfortable especially in her lower abdomen.  A lot of bloating and cramping.  This is often followed by prolonged toileting with a lot of loose stools after initial hard stools are expelled.  She does not see bleeding.  She has actually gaining weight lately despite a lot of cardio exercise and she watches her  diet quite well.  She has tried adding a powder fiber supplement on top of food, Benefiber for about a month without much response.   Old Data Reviewed: Labs January 2020 show normal CBC, normal complete metabolic profile, normal hemoglobin A1c, normal TSH     Review of systems: Pertinent positive and negative review of systems were noted in the above HPI section. All other review negative.   Past Medical History:  Diagnosis Date  . Endometrial cancer ([place]) 2010    Past Surgical History:  Procedure Laterality Date  . ABDOMINAL HYSTERECTOMY    . BREAST LUMPECTOMY Right 2007  . BREAST SURGERY     lumpectomy   . HYSTEROSCOPY    . MYOMECTOMY  2008  . OOPHORECTOMY Left 2010  . PELVIC LAPAROSCOPY      Current  Outpatient Medications  Medication Sig Dispense Refill  . LORazepam (ATIVAN) 1 MG tablet Take 1 tablet (1 mg total) by mouth every 8 (eight) hours as needed for anxiety. (Patient taking differently: Take 1 mg by mouth every 8 (eight) hours as needed (flying). ) 10 tablet 0   No current facility-administered medications for this visit.     Allergies as of 12/04/2018  . (No Known Allergies)    Family History  Problem Relation Age of Onset  . Hypertension Mother   . Hypertension Father   . Rheum arthritis Father   . Skin cancer Maternal Grandfather   . Heart attack Paternal Grandfather   . Diabetes Neg Hx   . Hyperlipidemia Neg Hx   . Sudden death Neg Hx     Social History   Socioeconomic History  . Marital status: Married    Spouse name: Not on file  . Number of children: 0  . Years of education: Not on file  . Highest education level: Not on file  Occupational History  . Occupation: finacial services  Social Needs  . Financial resource strain: Not on file  . Food insecurity:    Worry: Not on file    Inability: Not on file  . Transportation needs:    Medical: Not on file    Non-medical: Not on file  Tobacco Use  . Smoking status: Never Smoker  . Smokeless tobacco: Never Used  Substance and Sexual Activity  .  Alcohol use: Yes    Alcohol/week: 3.0 - 4.0 standard drinks    Types: 3 - 4 Standard drinks or equivalent per week  . Drug use: No  . Sexual activity: Yes    Partners: Male    Birth control/protection: Surgical    Comment: hysterectomy   Lifestyle  . Physical activity:    Days per week: Not on file    Minutes per session: Not on file  . Stress: Not on file  Relationships  . Social connections:    Talks on phone: Not on file    Gets together: Not on file    Attends religious service: Not on file    Active member of club or organization: Not on file    Attends meetings of clubs or organizations: Not on file    Relationship status: Not on file  .  Intimate partner violence:    Fear of current or ex partner: Not on file    Emotionally abused: Not on file    Physically abused: Not on file    Forced sexual activity: Not on file  Other Topics Concern  . Not on file  Social History Narrative  . Not on file     Physical Exam: BP 100/70 (BP Location: Left Arm, Patient Position: Sitting, Cuff Size: Normal)   Pulse 72   Ht 5\' 7"  (1.702 m) Comment: height measured without shoes  Wt 204 lb 8 oz (92.8 kg)   BMI 32.03 kg/m  Constitutional: generally well-appearing Psychiatric: alert and oriented x3 Eyes: extraocular movements intact Mouth: oral pharynx moist, no lesions Neck: supple no lymphadenopathy Cardiovascular: heart regular rate and rhythm Lungs: clear to auscultation bilaterally Abdomen: soft, nontender, nondistended, no obvious ascites, no peritoneal signs, normal bowel sounds Extremities: no lower extremity edema bilaterally Skin: no lesions on visible extremities   Assessment and plan: 48 y.o. female with alternating constipation diarrhea  I suspect these are functional issues.  I recommended a trial of daily Citrucel powder fiber supplement.  If this is not helpful then I would likely add MiraLAX as well.  I also recommended colonoscopy to rule out structural issues such as neoplasm which I think is very unlikely.  She had labs tested last month and they were all completely normal including CBC, complete metabolic profile, thyroid testing and I see no reason for any further blood tests or imaging studies prior to the colonoscopy above.   Please see the "Patient Instructions" section for addition details about the plan.   Joanna Loffler, MD [facility] 12/04/2018, 8:37 AM  Cc: Joanna Dom, MD

## 2019-01-05 ENCOUNTER — Telehealth: Payer: Self-pay | Admitting: *Deleted

## 2019-01-05 NOTE — Telephone Encounter (Signed)
Covid-19 travel screening questions  Have you traveled in the last 14 days? No If yes where?  Do you now or have you had a fever in the last 14 days? No  Do you have any respiratory symptoms of shortness of breath or cough now or in the last 14 days? No  Do you have any family members or close contacts with diagnosed or suspected Covid-19? No      Pt reports stuffy nose, discussed with Dr. Ardis Hughs. Ok to proceed.

## 2019-01-06 ENCOUNTER — Ambulatory Visit (AMBULATORY_SURGERY_CENTER): Payer: 59 | Admitting: Gastroenterology

## 2019-01-06 ENCOUNTER — Encounter: Payer: Self-pay | Admitting: Gastroenterology

## 2019-01-06 ENCOUNTER — Other Ambulatory Visit: Payer: Self-pay

## 2019-01-06 VITALS — BP 114/67 | HR 66 | Temp 97.7°F | Resp 10 | Ht 67.0 in | Wt 204.0 lb

## 2019-01-06 DIAGNOSIS — R194 Change in bowel habit: Secondary | ICD-10-CM | POA: Diagnosis present

## 2019-01-06 DIAGNOSIS — K5900 Constipation, unspecified: Secondary | ICD-10-CM

## 2019-01-06 DIAGNOSIS — R197 Diarrhea, unspecified: Secondary | ICD-10-CM | POA: Diagnosis not present

## 2019-01-06 MED ORDER — SODIUM CHLORIDE 0.9 % IV SOLN: 500.0000 mL | Freq: Once | INTRAVENOUS | Status: DC

## 2019-01-06 NOTE — Op Note (Signed)
[facility] Patient Name: Joanna Barker Procedure Date: 01/06/2019 9:00 AM MRN: [id number] Endoscopist: Milus Banister , MD Age: 48 Referring MD:  Date of Birth: 05-Feb-1971 Gender: Female Account #: [id number] Procedure:                Colonoscopy Indications:              Change in bowel habits Medicines:                Monitored Anesthesia Care Procedure:                Pre-Anesthesia Assessment:                           - Prior to the procedure, a History and Physical                            was performed, and patient medications and                            allergies were reviewed. The patient's tolerance of                            previous anesthesia was also reviewed. The risks                            and benefits of the procedure and the sedation                            options and risks were discussed with the patient.                            All questions were answered, and informed consent                            was obtained. Prior Anticoagulants: The patient has                            taken no previous anticoagulant or antiplatelet                            agents. ASA Grade Assessment: I - A normal, healthy                            patient. After reviewing the risks and benefits,                            the patient was deemed in satisfactory condition to                            undergo the procedure.                           After obtaining informed consent, the colonoscope  was passed under direct vision. Throughout the                            procedure, the patient's blood pressure, pulse, and                            oxygen saturations were monitored continuously. The                            Colonoscope was introduced through the anus and                            advanced to the the cecum, identified by                            appendiceal orifice and ileocecal valve. The                  colonoscopy was performed without difficulty. The                            patient tolerated the procedure well. The quality                            of the bowel preparation was excellent. The                            ileocecal valve, appendiceal orifice, and rectum                            were photographed. Scope In: 9:13:56 AM Scope Out: 9:27:01 AM Scope Withdrawal Time: 0 hours 6 minutes 40 seconds  Total Procedure Duration: 0 hours 13 minutes 5 seconds  Findings:                 The entire examined colon appeared normal on direct                            and retroflexion views. Complications:            No immediate complications. Estimated blood loss:                            None. Estimated Blood Loss:     Estimated blood loss: none. Impression:               - The entire examined colon is normal on direct and                            retroflexion views.                           - No polyps or cancers. Recommendation:           - Patient has a contact number available for                            emergencies.  The signs and symptoms of potential                            delayed complications were discussed with the                            patient. Return to normal activities tomorrow.                            Written discharge instructions were provided to the                            patient.                           - Resume previous diet.                           - Continue present medications. Continue daily OTC                            fiber supplement. Consider adding one dose of                            miralax powder PRN.                           - Repeat colonoscopy in 10 years for screening                            purposes. Milus Banister, MD 01/06/2019 9:30:48 AM This report has been signed electronically.

## 2019-01-06 NOTE — Progress Notes (Signed)
To PACU, VSS. Report to [email or web address] 

## 2019-01-06 NOTE — Patient Instructions (Signed)
Continue dailty Over The Counter fiber supplement, consider adding one dose of miralax powder PRN.  YOU HAD AN ENDOSCOPIC PROCEDURE TODAY AT [facility]:   Refer to the procedure report that was given to you for any specific questions about what was found during the examination.  If the procedure report does not answer your questions, please call your gastroenterologist to clarify.  If you requested that your care partner not be given the details of your procedure findings, then the procedure report has been included in a sealed envelope for you to review at your convenience later.  YOU SHOULD EXPECT: Some feelings of bloating in the abdomen. Passage of more gas than usual.  Walking can help get rid of the air that was put into your GI tract during the procedure and reduce the bloating. If you had a lower endoscopy (such as a colonoscopy or flexible sigmoidoscopy) you may notice spotting of blood in your stool or on the toilet paper. If you underwent a bowel prep for your procedure, you may not have a normal bowel movement for a few days.  Please Note:  You might notice some irritation and congestion in your nose or some drainage.  This is from the oxygen used during your procedure.  There is no need for concern and it should clear up in a day or so.  SYMPTOMS TO REPORT IMMEDIATELY:   Following lower endoscopy (colonoscopy or flexible sigmoidoscopy):  Excessive amounts of blood in the stool  Significant tenderness or worsening of abdominal pains  Swelling of the abdomen that is new, acute  Fever of 100F or higher  For urgent or emergent issues, a gastroenterologist can be reached at any hour by calling [phone].   DIET:  We do recommend a small meal at first, but then you may proceed to your regular diet.  Drink plenty of fluids but you should avoid alcoholic beverages for 24 hours.  ACTIVITY:  You should plan to take it easy for the rest of today and you should NOT  DRIVE or use heavy machinery until tomorrow (because of the sedation medicines used during the test).    FOLLOW UP: Our staff will call the number listed on your records the next business day following your procedure to check on you and address any questions or concerns that you may have regarding the information given to you following your procedure. If we do not reach you, we will leave a message.  However, if you are feeling well and you are not experiencing any problems, there is no need to return our call.  We will assume that you have returned to your regular daily activities without incident.  If any biopsies were taken you will be contacted by phone or by letter within the next 1-3 weeks.  Please call us at [phone] if you have not heard about the biopsies in 3 weeks.    SIGNATURES/CONFIDENTIALITY: You and/or your care partner have signed paperwork which will be entered into your electronic medical record.  These signatures attest to the fact that that the information above on your After Visit Summary has been reviewed and is understood.  Full responsibility of the confidentiality of this discharge information lies with you and/or your care-partner.

## 2019-01-07 ENCOUNTER — Telehealth: Payer: Self-pay | Admitting: *Deleted

## 2019-01-07 NOTE — Telephone Encounter (Signed)
  Follow up Call-  Call back number 01/06/2019  Post procedure Call Back phone  # [phone]  Permission to leave phone message Yes  Some recent data might be hidden     Patient questions:  Do you have a fever, pain , or abdominal swelling? No. Pain Score  0 *  Have you tolerated food without any problems? Yes.    Have you been able to return to your normal activities? Yes.    Do you have any questions about your discharge instructions: Diet   No. Medications  No. Follow up visit  No.  Do you have questions or concerns about your Care? No.  Actions: * If pain score is 4 or above: No action needed, pain <4.

## 2019-11-02 NOTE — Progress Notes (Signed)
49 y.o. G0P0000 Married White or Caucasian Not Hispanic or Latino female here for annual exam.      The paitent has a h/o adenocarcinoma of the endometrium and is s/p TLH/LSO in 5/10. She had an ultrasound in 12/18 for pain and was noted to have a solid mass in her left adnexa with vascular flow. MRI was done and tissue biopsy was recommended. She underwent a CT guided biopsy of the lesion which returned as a fibroid. She has had a consultation with Dr Denman George  She still has her right ovary. Ultrasound in 1/20 the left adnexal mass (known fibroid) measured 2.2 x 2.1 x 1.8 cm. Dr Denman George didn't feel she needed continued ultrasounds unless she became symptomatic.   She still goes from constipation to diarrhea, better than it was. She saw Dr Carlean Purl, told her to increase her fiber. Colonoscopy was normal. Tolerable.   No pelvic pain. No dyspareunia.   No LMP recorded. Patient has had a hysterectomy.          Sexually active: Yes.    The current method of family planning is status post hysterectomy.    Exercising: Yes.    pellaton walking and lifting  Smoker:  no  Health Maintenance: Pap:  2010 per patient  History of abnormal Pap:  no MMG:  12/02/18 Density C Birads 1 neg Colonoscopy: March, 2020, 10 year f/u TDaP:  09/26/17  Gardasil: no   reports that she has never smoked. She has never used smokeless tobacco. She reports current alcohol use of about 3.0 - 4.0 standard drinks of alcohol per week. She reports that she does not use drugs. 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   Past Medical History:  Diagnosis Date  . Endometrial cancer ([place]) 2010    Past Surgical History:  Procedure Laterality Date  . ABDOMINAL HYSTERECTOMY    . BREAST LUMPECTOMY Right 2007  . BREAST SURGERY     lumpectomy   . HYSTEROSCOPY    . MYOMECTOMY  2008  . OOPHORECTOMY Left 2010  . PELVIC LAPAROSCOPY      Current Outpatient Medications   Medication Sig Dispense Refill  . LORazepam (ATIVAN) 1 MG tablet Take 1 tablet (1 mg total) by mouth every 8 (eight) hours as needed for anxiety. (Patient taking differently: Take 1 mg by mouth every 8 (eight) hours as needed (flying). ) 10 tablet 0   No current facility-administered medications for this visit.    Family History  Problem Relation Age of Onset  . Hypertension Mother   . Hypertension Father   . Rheum arthritis Father   . Skin cancer Maternal Grandfather   . Heart attack Paternal Grandfather   . Diabetes Neg Hx   . Hyperlipidemia Neg Hx   . Sudden death Neg Hx   . Colon cancer Neg Hx   . Esophageal cancer Neg Hx   . Rectal cancer Neg Hx   . Stomach cancer Neg Hx     Review of Systems  All other systems reviewed and are negative.   Exam:   BP 110/64   Pulse 95   Temp 98.1 F (36.7 C)   Ht 5\' 7"  (1.702 m)   Wt 200 lb (90.7 kg)   SpO2 93%   BMI 31.32 kg/m   Weight change: @WEIGHTCHANGE @ Height:   Height: 5\' 7"  (170.2 cm)  Ht Readings from Last 3 Encounters:  11/10/19 5\' 7"  (1.702 m)  01/06/19 5\' 7"  (1.702 m)  12/04/18 5\' 7"  (1.702 m)    General appearance: alert, cooperative and appears stated age Head: Normocephalic, without obvious abnormality, atraumatic Neck: no adenopathy, supple, symmetrical, trachea midline and thyroid normal to inspection and palpation Lungs: clear to auscultation bilaterally Cardiovascular: regular rate and rhythm Breasts: normal appearance, no masses or tenderness Abdomen: soft, non-tender; non distended,  no masses,  no organomegaly Extremities: extremities normal, atraumatic, no cyanosis or edema Skin: Skin color, texture, turgor normal. No rashes or lesions Lymph nodes: Cervical, supraclavicular, and axillary nodes normal. No abnormal inguinal nodes palpated Neurologic: Grossly normal   Pelvic: External genitalia:  no lesions              Urethra:  normal appearing urethra with no masses, tenderness or lesions               Bartholins and Skenes: normal                 Vagina: normal appearing vagina with normal color and discharge, no lesions              Cervix: absent               Bimanual Exam:  Uterus:  uterus absent              Adnexa: no mass, fullness, tenderness               Rectovaginal: Confirms               Anus:  normal sphincter tone, no lesions  Royal Hawthorn chaperoned for the exam.  A:  Well Woman with normal exam  H/O hysterectomy for uterine cancer  Still has her right ovary  H/o left pelvic side wall myoma, stable on last ultrasound, not felt on exam, no pain.  BMI 31    P:   No pap needed  Discussed breast self exam  Discussed calcium and vit D intake  Mammogram due next month  Colonoscopy UTD  Labs UTD with primary

## 2019-11-10 ENCOUNTER — Ambulatory Visit (INDEPENDENT_AMBULATORY_CARE_PROVIDER_SITE_OTHER): Payer: 59 | Admitting: Obstetrics and Gynecology

## 2019-11-10 ENCOUNTER — Other Ambulatory Visit: Payer: Self-pay

## 2019-11-10 ENCOUNTER — Encounter: Payer: Self-pay | Admitting: Obstetrics and Gynecology

## 2019-11-10 VITALS — BP 110/64 | HR 95 | Temp 98.1°F | Ht 67.0 in | Wt 200.0 lb

## 2019-11-10 DIAGNOSIS — K582 Mixed irritable bowel syndrome: Secondary | ICD-10-CM

## 2019-11-10 DIAGNOSIS — Z8542 Personal history of malignant neoplasm of other parts of uterus: Secondary | ICD-10-CM

## 2019-11-10 DIAGNOSIS — Z01419 Encounter for gynecological examination (general) (routine) without abnormal findings: Secondary | ICD-10-CM

## 2019-11-10 NOTE — Patient Instructions (Signed)
EXERCISE AND DIET:  We recommended that you start or continue a regular exercise program for good health. Regular exercise means any activity that makes your heart beat faster and makes you sweat.  We recommend exercising at least 30 minutes per day at least 3 days a week, preferably 4 or 5.  We also recommend a diet low in fat and sugar.  Inactivity, poor dietary choices and obesity can cause diabetes, heart attack, stroke, and kidney damage, among others.    ALCOHOL AND SMOKING:  Women should limit their alcohol intake to no more than 7 drinks/beers/glasses of wine (combined, not each!) per week. Moderation of alcohol intake to this level decreases your risk of breast cancer and liver damage. And of course, no recreational drugs are part of a healthy lifestyle.  And absolutely no smoking or even second hand smoke. Most people know smoking can cause heart and lung diseases, but did you know it also contributes to weakening of your bones? Aging of your skin?  Yellowing of your teeth and nails?  CALCIUM AND VITAMIN D:  Adequate intake of calcium and Vitamin D are recommended.  The recommendations for exact amounts of these supplements seem to change often, but generally speaking 1,000 mg of calcium (between diet and supplement) and 800 units of Vitamin D per day seems prudent. Certain women may benefit from higher intake of Vitamin D.  If you are among these women, your doctor will have told you during your visit.    PAP SMEARS:  Pap smears, to check for cervical cancer or precancers,  have traditionally been done yearly, although recent scientific advances have shown that most women can have pap smears less often.  However, every woman still should have a physical exam from her gynecologist every year. It will include a breast check, inspection of the vulva and vagina to check for abnormal growths or skin changes, a visual exam of the cervix, and then an exam to evaluate the size and shape of the uterus and  ovaries.  And after 47 years of age, a rectal exam is indicated to check for rectal cancers. We will also provide age appropriate advice regarding health maintenance, like when you should have certain vaccines, screening for sexually transmitted diseases, bone density testing, colonoscopy, mammograms, etc.   MAMMOGRAMS:  All women over 40 years old should have a yearly mammogram. Many facilities now offer a "3D" mammogram, which may cost around $50 extra out of pocket. If possible,  we recommend you accept the option to have the 3D mammogram performed.  It both reduces the number of women who will be called back for extra views which then turn out to be normal, and it is better than the routine mammogram at detecting truly abnormal areas.    COLON CANCER SCREENING: Now recommend starting at age 45. At this time colonoscopy is not covered for routine screening until 50. There are take home tests that can be done between 45-49.   COLONOSCOPY:  Colonoscopy to screen for colon cancer is recommended for all women at age 50.  We know, you hate the idea of the prep.  We agree, BUT, having colon cancer and not knowing it is worse!!  Colon cancer so often starts as a polyp that can be seen and removed at colonscopy, which can quite literally save your life!  And if your first colonoscopy is normal and you have no family history of colon cancer, most women don't have to have it again for   10 years.  Once every ten years, you can do something that may end up saving your life, right?  We will be happy to help you get it scheduled when you are ready.  Be sure to check your insurance coverage so you understand how much it will cost.  It may be covered as a preventative service at no cost, but you should check your particular policy.      Breast Self-Awareness Breast self-awareness means being familiar with how your breasts look and feel. It involves checking your breasts regularly and reporting any changes to your  health care provider. Practicing breast self-awareness is important. A change in your breasts can be a sign of a serious medical problem. Being familiar with how your breasts look and feel allows you to find any problems early, when treatment is more likely to be successful. All women should practice breast self-awareness, including women who have had breast implants. How to do a breast self-exam One way to learn what is normal for your breasts and whether your breasts are changing is to do a breast self-exam. To do a breast self-exam: Look for Changes  1. Remove all the clothing above your waist. 2. Stand in front of a mirror in a room with good lighting. 3. Put your hands on your hips. 4. Push your hands firmly downward. 5. Compare your breasts in the mirror. Look for differences between them (asymmetry), such as: ? Differences in shape. ? Differences in size. ? Puckers, dips, and bumps in one breast and not the other. 6. Look at each breast for changes in your skin, such as: ? Redness. ? Scaly areas. 7. Look for changes in your nipples, such as: ? Discharge. ? Bleeding. ? Dimpling. ? Redness. ? A change in position. Feel for Changes Carefully feel your breasts for lumps and changes. It is best to do this while lying on your back on the floor and again while sitting or standing in the shower or tub with soapy water on your skin. Feel each breast in the following way:  Place the arm on the side of the breast you are examining above your head.  Feel your breast with the other hand.  Start in the nipple area and make  inch (2 cm) overlapping circles to feel your breast. Use the pads of your three middle fingers to do this. Apply light pressure, then medium pressure, then firm pressure. The light pressure will allow you to feel the tissue closest to the skin. The medium pressure will allow you to feel the tissue that is a little deeper. The firm pressure will allow you to feel the tissue  close to the ribs.  Continue the overlapping circles, moving downward over the breast until you feel your ribs below your breast.  Move one finger-width toward the center of the body. Continue to use the  inch (2 cm) overlapping circles to feel your breast as you move slowly up toward your collarbone.  Continue the up and down exam using all three pressures until you reach your armpit.  Write Down What You Find  Write down what is normal for each breast and any changes that you find. Keep a written record with breast changes or normal findings for each breast. By writing this information down, you do not need to depend only on memory for size, tenderness, or location. Write down where you are in your menstrual cycle, if you are still menstruating. If you are having trouble noticing differences   in your breasts, do not get discouraged. With time you will become more familiar with the variations in your breasts and more comfortable with the exam. How often should I examine my breasts? Examine your breasts every month. If you are breastfeeding, the best time to examine your breasts is after a feeding or after using a breast pump. If you menstruate, the best time to examine your breasts is 5-7 days after your period is over. During your period, your breasts are lumpier, and it may be more difficult to notice changes. When should I see my health care provider? See your health care provider if you notice:  A change in shape or size of your breasts or nipples.  A change in the skin of your breast or nipples, such as a reddened or scaly area.  Unusual discharge from your nipples.  A lump or thick area that was not there before.  Pain in your breasts.  Anything that concerns you.  

## 2019-12-08 ENCOUNTER — Encounter: Payer: Self-pay | Admitting: Obstetrics and Gynecology

## 2020-01-07 ENCOUNTER — Ambulatory Visit: Payer: 59 | Attending: Internal Medicine

## 2020-01-07 DIAGNOSIS — Z23 Encounter for immunization: Secondary | ICD-10-CM

## 2020-01-07 NOTE — Progress Notes (Signed)
   Covid-19 Vaccination Clinic  Name:  Marcee Lallo    MRN: [id number] DOB: 07-May-1971  01/07/2020  Ms. Rosch was observed post Covid-19 immunization for 15 minutes without incident. She was provided with Vaccine Information Sheet and instruction to access the V-Safe system.   Ms. Lamond was instructed to call 911 with any severe reactions post vaccine: Marland Kitchen Difficulty breathing  . Swelling of face and throat  . A fast heartbeat  . A bad rash all over body  . Dizziness and weakness   Immunizations Administered    Name Date Dose VIS Date Route   Pfizer COVID-19 Vaccine 01/07/2020  8:30 AM 0.3 mL 10/01/2019 Intramuscular   Manufacturer: [place]   Lot: [id number]   [place]: [id number]

## 2020-01-26 ENCOUNTER — Ambulatory Visit: Payer: 59

## 2020-01-26 ENCOUNTER — Ambulatory Visit: Payer: 59 | Attending: Internal Medicine

## 2020-01-26 DIAGNOSIS — Z23 Encounter for immunization: Secondary | ICD-10-CM

## 2020-01-26 NOTE — Progress Notes (Signed)
   Covid-19 Vaccination Clinic  Name:  Yamiled Picone    MRN: [id number] DOB: 1970/10/22  01/26/2020  Ms. Seaver was observed post Covid-19 immunization for 15 minutes without incident. She was provided with Vaccine Information Sheet and instruction to access the V-Safe system.   Ms. Friddle was instructed to call 911 with any severe reactions post vaccine: Marland Kitchen Difficulty breathing  . Swelling of face and throat  . A fast heartbeat  . A bad rash all over body  . Dizziness and weakness   Immunizations Administered    Name Date Dose VIS Date Route   Pfizer COVID-19 Vaccine 01/26/2020  9:29 AM 0.3 mL 10/01/2019 Intramuscular   Manufacturer: [place]   Lot: [id number]   [place]: [id number]

## 2020-11-14 NOTE — Progress Notes (Signed)
50 y.o. G0P0000 Married White or Caucasian Not Hispanic or Latino female here for annual exam.   She has had some intermittent pain in the right pelvis, more regular since November. She has increased her exercise and core work. She notices the pain often when she is sitting, 2-3/10 in severity. Notices it every couple of days, just aware of it. Still has a right ovary. Sexually active, no dyspareunia.     The paitent has a h/o adenocarcinoma of the endometrium and is s/p TLH/LSO in 5/10. She had an ultrasound in 12/18 for pain and was noted to have a solid mass in her left adnexa with vascular flow. MRI was done and tissue biopsy was recommended. She underwent a CT guided biopsy of the lesion which returned as a fibroid. She has had a consultation with Dr Denman George  She still has her right ovary. Ultrasound in 1/20 the left adnexal mass (known fibroid) measured 2.2 x 2.1 x 1.8 cm. Dr Denman George didn't feel she needed continued ultrasounds unless she became symptomatic.  Not having pain on the left.   No vasomotors symptoms, no bowel or bladder issues. No correlation of her Right pelvic pain and BM's.   Trying to loose weight. She is exercising 1.5-2 hours a day. She did a low carbs for 9 months, only lost 2 lbs. Has gained 4 lbs since she started eating carbs (still low carb). Doesn't drink a lot   No LMP recorded. Patient has had a hysterectomy.          Sexually active: Yes.    The current method of family planning is status post hysterectomy.    Exercising: Yes.    daily Smoker:  no  Health Maintenance: Pap:  2010 per patient  History of abnormal Pap:  no MMG:  12-08-19 density C/BRIADS 1 negative  Colonoscopy: 01-06-2019 normal, 10 year f/u TDaP:  09-26-17 Gardasil: no   reports that she has never smoked. She has never used smokeless tobacco. She reports current alcohol use of about 3.0 - 4.0 standard drinks of alcohol per week. She reports that she does not use drugs. 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  Past Medical History:  Diagnosis Date  . Endometrial cancer ([place]) 2010    Past Surgical History:  Procedure Laterality Date  . ABDOMINAL HYSTERECTOMY    . BREAST LUMPECTOMY Right 2007  . BREAST SURGERY     lumpectomy   . HYSTEROSCOPY    . MYOMECTOMY  2008  . OOPHORECTOMY Left 2010  . PELVIC LAPAROSCOPY      Current Outpatient Medications  Medication Sig Dispense Refill  . COLLAGEN PO Take by mouth.    . cyanocobalamin 100 MCG tablet Take 100 mcg by mouth daily.    Marland Kitchen LORazepam (ATIVAN) 1 MG tablet Take 1 tablet (1 mg total) by mouth every 8 (eight) hours as needed for anxiety. (Patient taking differently: Take 1 mg by mouth every 8 (eight) hours as needed (flying).) 10 tablet 0  . NON FORMULARY HB5 - hormone balance     No current facility-administered medications for this visit.    Family History  Problem Relation Age of Onset  . Hypertension Mother   . Hypertension Father   . Rheum arthritis Father   . Skin cancer Maternal Grandfather   . Heart attack Paternal Grandfather   . Diabetes Neg Hx   . Hyperlipidemia Neg Hx   . Sudden death Neg Hx   . Colon cancer  Neg Hx   . Esophageal cancer Neg Hx   . Rectal cancer Neg Hx   . Stomach cancer Neg Hx     Review of Systems  Constitutional: Negative.   HENT: Negative.   Eyes: Negative.   Respiratory: Negative.   Cardiovascular: Negative.   Gastrointestinal: Negative.   Endocrine: Negative.   Genitourinary: Negative.   Musculoskeletal: Negative.   Skin: Negative.   Allergic/Immunologic: Negative.   Neurological: Negative.   Hematological: Negative.   Psychiatric/Behavioral: Negative.     Exam:   BP 120/70 (BP Location: Left Arm, Patient Position: Sitting, Cuff Size: Large)   Pulse 72   Ht 5\' 7"  (1.702 m)   Wt 206 lb (93.4 kg)   BMI 32.26 kg/m   Weight change: @WEIGHTCHANGE @ Height:   Height: 5\' 7"  (170.2 cm)  Ht Readings from Last 3 Encounters:  11/15/20 5\' 7"   (1.702 m)  11/10/19 5\' 7"  (1.702 m)  01/06/19 5\' 7"  (1.702 m)    General appearance: alert, cooperative and appears stated age Head: Normocephalic, without obvious abnormality, atraumatic Neck: no adenopathy, supple, symmetrical, trachea midline and thyroid normal to inspection and palpation Lungs: clear to auscultation bilaterally Cardiovascular: regular rate and rhythm Breasts: normal appearance, no masses or tenderness Abdomen: soft, mildly tender in the RLQ (right above the PS), no rebound, no guarding. Not tender with palpation with tensed abdominal muscles. Non distended,  no masses,  no organomegaly Extremities: extremities normal, atraumatic, no cyanosis or edema Skin: Skin color, texture, turgor normal. No rashes or lesions Lymph nodes: Cervical, supraclavicular, and axillary nodes normal. No abnormal inguinal nodes palpated Neurologic: Grossly normal   Pelvic: External genitalia:  no lesions              Urethra:  normal appearing urethra with no masses, tenderness or lesions              Bartholins and Skenes: normal                 Vagina: normal appearing vagina with normal color and discharge, no lesions              Cervix: absent               Bimanual Exam:  Uterus:  uterus absent              Adnexa: no mass, fullness, tenderness               Rectovaginal: Confirms               Anus:  normal sphincter tone, no lesions  Pelvic floor: not tender  Terence Lux chaperoned for the exam.  1. Well woman exam Discussed breast self exam  Discussed calcium and vit D intake Mammogram next month Colonoscopy is UTD  2. History of endometrial cancer Now with pelvic pain - US PELVIS TRANSVAGINAL NON-OB (TV ONLY); Future  3. Fibroid On the left pelvic side wall  4. Laboratory exam ordered as part of routine general medical examination Not fasting - CBC - Comprehensive metabolic panel - Lipid panel  5. Weight gain  - TSH  6. Pelvic pain Normal exam, no  MS tenderness - US PELVIS TRANSVAGINAL NON-OB (TV ONLY); Future

## 2020-11-15 ENCOUNTER — Encounter: Payer: Self-pay | Admitting: Obstetrics and Gynecology

## 2020-11-15 ENCOUNTER — Ambulatory Visit (INDEPENDENT_AMBULATORY_CARE_PROVIDER_SITE_OTHER): Payer: 59 | Admitting: Obstetrics and Gynecology

## 2020-11-15 ENCOUNTER — Other Ambulatory Visit: Payer: Self-pay

## 2020-11-15 VITALS — BP 120/70 | HR 72 | Ht 67.0 in | Wt 206.0 lb

## 2020-11-15 DIAGNOSIS — R635 Abnormal weight gain: Secondary | ICD-10-CM

## 2020-11-15 DIAGNOSIS — Z01419 Encounter for gynecological examination (general) (routine) without abnormal findings: Secondary | ICD-10-CM

## 2020-11-15 DIAGNOSIS — D219 Benign neoplasm of connective and other soft tissue, unspecified: Secondary | ICD-10-CM | POA: Diagnosis not present

## 2020-11-15 DIAGNOSIS — Z8542 Personal history of malignant neoplasm of other parts of uterus: Secondary | ICD-10-CM | POA: Diagnosis not present

## 2020-11-15 DIAGNOSIS — Z0000 Encounter for general adult medical examination without abnormal findings: Secondary | ICD-10-CM | POA: Diagnosis not present

## 2020-11-15 DIAGNOSIS — R102 Pelvic and perineal pain: Secondary | ICD-10-CM

## 2020-11-15 LAB — CBC
HCT: 38.2 % (ref 35.0–45.0)
Hemoglobin: 12.9 g/dL (ref 11.7–15.5)
MCH: 31.2 pg (ref 27.0–33.0)
MCHC: 33.8 g/dL (ref 32.0–36.0)
MCV: 92.3 fL (ref 80.0–100.0)
MPV: 10.3 fL (ref 7.5–12.5)
Platelets: 270 10*3/uL (ref 140–400)
RBC: 4.14 10*6/uL (ref 3.80–5.10)
RDW: 12.3 % (ref 11.0–15.0)
WBC: 5.5 10*3/uL (ref 3.8–10.8)

## 2020-11-15 LAB — COMPREHENSIVE METABOLIC PANEL
AG Ratio: 1.8 (calc) (ref 1.0–2.5)
ALT: 16 U/L (ref 6–29)
AST: 15 U/L (ref 10–35)
Albumin: 4.2 g/dL (ref 3.6–5.1)
Alkaline phosphatase (APISO): 26 U/L — ABNORMAL LOW (ref 31–125)
BUN: 18 mg/dL (ref 7–25)
CO2: 29 mmol/L (ref 20–32)
Calcium: 9.5 mg/dL (ref 8.6–10.2)
Chloride: 104 mmol/L (ref 98–110)
Creat: 0.85 mg/dL (ref 0.50–1.10)
Globulin: 2.4 g/dL (calc) (ref 1.9–3.7)
Glucose, Bld: 98 mg/dL (ref 65–99)
Potassium: 4.6 mmol/L (ref 3.5–5.3)
Sodium: 136 mmol/L (ref 135–146)
Total Bilirubin: 0.3 mg/dL (ref 0.2–1.2)
Total Protein: 6.6 g/dL (ref 6.1–8.1)

## 2020-11-15 LAB — LIPID PANEL
Cholesterol: 163 mg/dL (ref <200)
HDL: 63 mg/dL (ref >=50)
LDL Cholesterol (Calc): 86 mg/dL (calc)
Non-HDL Cholesterol (Calc): 100 mg/dL (calc) (ref <130)
Total CHOL/HDL Ratio: 2.6 (calc) (ref <5.0)
Triglycerides: 56 mg/dL (ref <150)

## 2020-11-15 LAB — TSH: TSH: 2.2 mIU/L

## 2020-11-15 NOTE — Patient Instructions (Signed)

## 2020-12-13 ENCOUNTER — Encounter: Payer: Self-pay | Admitting: Obstetrics and Gynecology

## 2021-05-07 ENCOUNTER — Telehealth: Payer: Self-pay

## 2021-05-07 NOTE — Telephone Encounter (Signed)
Joanna Dom, MD  11/22/2020  1:12 PM EST      Please try and add a zinc level onto her blood work, for low alk phos level. If you can't get it added, please have her come back for a repeat alk phos and azinc level. Thanks!    Dr. Talbert Nan, the lab has finally received the tube to be able to do the Zinc test.  I wanted to check with you, since so much time has passed, before I called the patient to confirm that you still just want alk phos and zinc level drawn.

## 2021-05-09 NOTE — Telephone Encounter (Signed)
The Zinc level charge is $91.11.  Every insurance plan is different how they cover lab work.

## 2021-05-11 NOTE — Telephone Encounter (Signed)
It has been 6 months since her original low alk phos. I would recommend we repeat the alk phos and check a phosphorous. Please offer her the zinc test now, or if her level is still low.

## 2021-05-14 ENCOUNTER — Encounter: Payer: Self-pay | Admitting: Obstetrics and Gynecology

## 2021-05-14 NOTE — Telephone Encounter (Signed)
Spoke with patient and informed her.  SHe opt to proceed with lab testing for the Zinc level as well as the Alk Phos and Phosphorous.    Orders placed. She will contact us back to arrange lab appt when she is at her desk.

## 2021-05-15 ENCOUNTER — Other Ambulatory Visit: Payer: Self-pay

## 2021-05-15 ENCOUNTER — Other Ambulatory Visit: Payer: 59

## 2021-05-16 LAB — PHOSPHORUS: Phosphorus: 3.7 mg/dL (ref 2.5–4.5)

## 2021-05-16 LAB — ALKALINE PHOSPHATASE: Alkaline phosphatase (APISO): 33 U/L (ref 31–125)

## 2021-05-17 ENCOUNTER — Other Ambulatory Visit: Payer: Self-pay

## 2021-12-24 ENCOUNTER — Encounter: Payer: Self-pay | Admitting: Obstetrics and Gynecology

## 2022-12-25 ENCOUNTER — Encounter: Payer: Self-pay | Admitting: Obstetrics and Gynecology

## 2023-02-28 ENCOUNTER — Other Ambulatory Visit (HOSPITAL_BASED_OUTPATIENT_CLINIC_OR_DEPARTMENT_OTHER): Payer: Self-pay

## 2023-03-03 ENCOUNTER — Other Ambulatory Visit: Payer: Self-pay

## 2023-03-03 MED ORDER — ZEPBOUND 2.5 MG/0.5ML ~~LOC~~ SOAJ
2.5000 mg | SUBCUTANEOUS | 0 refills | Status: AC
  Filled 2023-03-03: qty 2, 28d supply, fill #0

## 2023-03-04 ENCOUNTER — Other Ambulatory Visit: Payer: Self-pay

## 2023-03-04 MED ORDER — ZEPBOUND 5 MG/0.5ML ~~LOC~~ SOAJ
5.0000 mg | SUBCUTANEOUS | 1 refills | Status: AC
  Filled 2023-03-04 – 2023-03-27 (×5): qty 2, 28d supply, fill #0

## 2023-03-19 ENCOUNTER — Other Ambulatory Visit: Payer: Self-pay

## 2023-03-25 ENCOUNTER — Other Ambulatory Visit: Payer: Self-pay

## 2023-03-27 ENCOUNTER — Other Ambulatory Visit: Payer: Self-pay

## 2023-04-28 ENCOUNTER — Other Ambulatory Visit: Payer: Self-pay

## 2023-06-17 ENCOUNTER — Other Ambulatory Visit (HOSPITAL_BASED_OUTPATIENT_CLINIC_OR_DEPARTMENT_OTHER): Payer: Self-pay

## 2023-06-17 MED ORDER — ZEPBOUND 10 MG/0.5ML ~~LOC~~ SOAJ
10.0000 mg | SUBCUTANEOUS | 3 refills | Status: DC
  Filled 2023-06-17: qty 2, 28d supply, fill #0
  Filled 2023-07-16: qty 2, 28d supply, fill #1
  Filled 2023-08-13: qty 2, 28d supply, fill #2
  Filled 2023-09-10: qty 2, 28d supply, fill #3

## 2023-07-16 ENCOUNTER — Other Ambulatory Visit (HOSPITAL_BASED_OUTPATIENT_CLINIC_OR_DEPARTMENT_OTHER): Payer: Self-pay

## 2023-07-16 ENCOUNTER — Other Ambulatory Visit: Payer: Self-pay

## 2023-10-10 ENCOUNTER — Other Ambulatory Visit (HOSPITAL_BASED_OUTPATIENT_CLINIC_OR_DEPARTMENT_OTHER): Payer: Self-pay

## 2023-10-10 MED ORDER — ZEPBOUND 10 MG/0.5ML ~~LOC~~ SOAJ
10.0000 mg | SUBCUTANEOUS | 3 refills | Status: AC
  Filled 2023-10-10: qty 2, 28d supply, fill #0
  Filled 2023-11-07: qty 2, 28d supply, fill #1
  Filled 2023-12-02: qty 2, 28d supply, fill #2
  Filled 2024-03-25: qty 2, 28d supply, fill #3

## 2024-01-01 ENCOUNTER — Other Ambulatory Visit (HOSPITAL_BASED_OUTPATIENT_CLINIC_OR_DEPARTMENT_OTHER): Payer: Self-pay

## 2024-01-01 MED ORDER — ZEPBOUND 10 MG/0.5ML ~~LOC~~ SOAJ
10.0000 mg | SUBCUTANEOUS | 3 refills | Status: AC
  Filled 2024-01-01: qty 2, 28d supply, fill #0
  Filled 2024-02-26: qty 2, 28d supply, fill #1
  Filled 2024-04-22: qty 2, 28d supply, fill #2
  Filled 2024-06-22: qty 2, 28d supply, fill #3

## 2024-01-29 ENCOUNTER — Other Ambulatory Visit: Payer: Self-pay

## 2024-01-29 ENCOUNTER — Other Ambulatory Visit (HOSPITAL_BASED_OUTPATIENT_CLINIC_OR_DEPARTMENT_OTHER): Payer: Self-pay

## 2024-01-29 MED ORDER — ZEPBOUND 10 MG/0.5ML ~~LOC~~ SOAJ
10.0000 mg | SUBCUTANEOUS | 3 refills | Status: AC
  Filled 2024-01-29: qty 2, 28d supply, fill #0
  Filled 2024-05-19: qty 2, 28d supply, fill #1
  Filled 2024-07-26: qty 2, 28d supply, fill #2
  Filled 2024-08-20: qty 2, 28d supply, fill #3

## 2024-04-22 ENCOUNTER — Other Ambulatory Visit (HOSPITAL_BASED_OUTPATIENT_CLINIC_OR_DEPARTMENT_OTHER): Payer: Self-pay

## 2024-04-22 MED ORDER — SCOPOLAMINE 1 MG/3DAYS TD PT72
1.0000 | MEDICATED_PATCH | TRANSDERMAL | 1 refills | Status: DC
  Filled 2024-04-22: qty 4, 12d supply, fill #0

## 2024-04-22 MED ORDER — ZEPBOUND 10 MG/0.5ML ~~LOC~~ SOAJ
10.0000 mg | SUBCUTANEOUS | 3 refills | Status: AC
  Filled 2024-04-22 – 2024-09-25 (×2): qty 2, 28d supply, fill #0
  Filled 2024-10-23 – 2024-10-29 (×2): qty 2, 28d supply, fill #1
  Filled 2024-11-23: qty 2, 28d supply, fill #2

## 2024-04-22 MED ORDER — MECLIZINE HCL 25 MG PO TABS
25.0000 mg | ORAL_TABLET | Freq: Three times a day (TID) | ORAL | 0 refills | Status: DC | PRN
  Filled 2024-04-22: qty 30, 10d supply, fill #0

## 2024-06-22 ENCOUNTER — Other Ambulatory Visit (HOSPITAL_BASED_OUTPATIENT_CLINIC_OR_DEPARTMENT_OTHER): Payer: Self-pay

## 2024-06-24 ENCOUNTER — Other Ambulatory Visit (HOSPITAL_BASED_OUTPATIENT_CLINIC_OR_DEPARTMENT_OTHER): Payer: Self-pay

## 2024-06-25 ENCOUNTER — Other Ambulatory Visit (HOSPITAL_BASED_OUTPATIENT_CLINIC_OR_DEPARTMENT_OTHER): Payer: Self-pay

## 2024-06-28 ENCOUNTER — Other Ambulatory Visit (HOSPITAL_BASED_OUTPATIENT_CLINIC_OR_DEPARTMENT_OTHER): Payer: Self-pay

## 2024-06-29 ENCOUNTER — Other Ambulatory Visit (HOSPITAL_BASED_OUTPATIENT_CLINIC_OR_DEPARTMENT_OTHER): Payer: Self-pay

## 2024-09-25 ENCOUNTER — Other Ambulatory Visit (HOSPITAL_BASED_OUTPATIENT_CLINIC_OR_DEPARTMENT_OTHER): Payer: Self-pay

## 2024-09-30 ENCOUNTER — Other Ambulatory Visit (HOSPITAL_BASED_OUTPATIENT_CLINIC_OR_DEPARTMENT_OTHER): Payer: Self-pay

## 2024-10-26 ENCOUNTER — Other Ambulatory Visit (HOSPITAL_BASED_OUTPATIENT_CLINIC_OR_DEPARTMENT_OTHER): Payer: Self-pay

## 2024-10-26 ENCOUNTER — Encounter (HOSPITAL_BASED_OUTPATIENT_CLINIC_OR_DEPARTMENT_OTHER): Payer: Self-pay

## 2024-10-27 ENCOUNTER — Other Ambulatory Visit (HOSPITAL_BASED_OUTPATIENT_CLINIC_OR_DEPARTMENT_OTHER): Payer: Self-pay

## 2024-10-29 ENCOUNTER — Other Ambulatory Visit (HOSPITAL_BASED_OUTPATIENT_CLINIC_OR_DEPARTMENT_OTHER): Payer: Self-pay

## 2024-11-23 ENCOUNTER — Other Ambulatory Visit (HOSPITAL_BASED_OUTPATIENT_CLINIC_OR_DEPARTMENT_OTHER): Payer: Self-pay
